# Patient Record
Sex: Female | Born: 1985 | Race: White | Hispanic: No | Marital: Married | Smoking: Former smoker
Health system: Southern US, Community
[De-identification: ages and names within clinical notes are randomized; demographics above are authoritative.]

## PROBLEM LIST (undated history)

## (undated) DIAGNOSIS — D649 Anemia, unspecified: Secondary | ICD-10-CM

## (undated) DIAGNOSIS — G4730 Sleep apnea, unspecified: Secondary | ICD-10-CM

## (undated) DIAGNOSIS — I10 Essential (primary) hypertension: Secondary | ICD-10-CM

## (undated) DIAGNOSIS — F32A Depression, unspecified: Secondary | ICD-10-CM

## (undated) DIAGNOSIS — C801 Malignant (primary) neoplasm, unspecified: Secondary | ICD-10-CM

## (undated) DIAGNOSIS — F329 Major depressive disorder, single episode, unspecified: Secondary | ICD-10-CM

## (undated) DIAGNOSIS — Z808 Family history of malignant neoplasm of other organs or systems: Secondary | ICD-10-CM

## (undated) DIAGNOSIS — Z800 Family history of malignant neoplasm of digestive organs: Secondary | ICD-10-CM

## (undated) HISTORY — DX: Family history of malignant neoplasm of other organs or systems: Z80.8

## (undated) HISTORY — DX: Family history of malignant neoplasm of digestive organs: Z80.0

---
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
## 2016-01-22 ENCOUNTER — Emergency Department
Admission: EM | Admit: 2016-01-22 | Discharge: 2016-01-22 | Disposition: A | Discharge status: Home / Self Care | Payer: 59 | Source: Home / Self Care | Attending: Family Medicine | Admitting: Family Medicine

## 2016-01-22 ENCOUNTER — Encounter: Payer: Self-pay | Admitting: Emergency Medicine

## 2016-01-22 DIAGNOSIS — G5603 Carpal tunnel syndrome, bilateral upper limbs: Secondary | ICD-10-CM

## 2016-01-22 DIAGNOSIS — M654 Radial styloid tenosynovitis [de Quervain]: Secondary | ICD-10-CM

## 2016-01-22 DIAGNOSIS — I10 Essential (primary) hypertension: Secondary | ICD-10-CM

## 2016-01-22 DIAGNOSIS — E6609 Other obesity due to excess calories: Secondary | ICD-10-CM

## 2016-01-22 DIAGNOSIS — E66811 Obesity, class 1: Secondary | ICD-10-CM

## 2016-01-22 HISTORY — DX: Depression, unspecified: F32.A

## 2016-01-22 HISTORY — DX: Major depressive disorder, single episode, unspecified: F32.9

## 2016-01-22 NOTE — ED Provider Notes (Signed)
R[facility] Her[facility]    CSN: [id number] Arrival date & time: 01/22/16  1747     History   Chief Complaint Chief Complaint  Patient presents with  . Wrist Pain    bilateral/chronic/typist  . Leg Swelling    bilateral/chronic  . Insomnia    chronic    HPI Rachel Herrera is a 30 y.o. female.   Patient presents at a late hour with multiple medical concerns.  She states that she realizes she will not be able to undergo comprehensive medical evaluation this evening, but wants to initiate the process.   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  She also notes gradually increasing weight gain and generalized swelling. She states that she has not visited a physician for about 10 years.  She has a past history of PCOS (she was unable to tolerate metformin).  She continues to smoke.   The history is provided by the patient.    Past Medical History:  Diagnosis Date  . Depression     There are no active problems to display for this patient.   History reviewed. No pertinent surgical history.  OB History    No data available       Home Medications    Prior to Admission medications   Medication Sig Start Date End Date Taking? Authorizing Provider  Multiple Vitamin (MULTIVITAMIN) capsule Take 1 capsule by mouth daily.   Yes Historical Provider, MD    Family History History reviewed. No pertinent family history.  Social History Social History  Substance Use Topics  . Smoking status: Current Every Day Smoker  . Smokeless tobacco: Never Used  . Alcohol use Yes     Allergies   Patient has no known allergies.   Review of Systems Review of Systems  Constitutional: Positive for activity change, fatigue and unexpected weight change. Negative for appetite change, chills, diaphoresis and fever.  HENT: Negative.    Eyes: Negative.   Respiratory: Negative for cough, chest tightness, shortness of breath and wheezing.   Cardiovascular: Positive for leg swelling. Negative for chest pain.  Gastrointestinal: Negative.   Genitourinary: Negative.   Musculoskeletal: Positive for arthralgias and joint swelling.  Skin: Negative.   Neurological: Positive for numbness.     Physical Exam Triage Vital Signs ED Triage Vitals  Enc Vitals Group     BP 01/22/16 1821 182/73     Pulse Rate 01/22/16 1821 98     Resp 01/22/16 1821 22     Temp 01/22/16 1821 98.3 F (36.8 C)     Temp Source 01/22/16 1821 Oral     SpO2 01/22/16 1821 94 %     Weight 01/22/16 1822 (!) 425 lb (192.8 kg)     Height 01/22/16 1822 5' 9.75" (1.772 m)     Head Circumference --      Peak Flow --      Pain Score 01/22/16 1824 2     Pain Loc --      Pain Edu? --      Excl. in [place]? --    No data found.   Updated Vital Signs BP 182/73 (BP Location: Left Wrist)   Pulse 98   Temp 98.3 F (36.8 C) (Oral)   Resp 22   Ht 5' 9.75" (1.772 m)   Wt (!) 425 lb (192.8 kg)   LMP  09/22/2015 (Approximate)   SpO2 94%   BMI 61.42 kg/m   Visual Acuity Right Eye Distance:   Left Eye Distance:   Bilateral Distance:    Right Eye Near:   Left Eye Near:    Bilateral Near:     Physical Exam Nursing notes and Vital Signs reviewed. Appearance:  Patient appears stated age, and in no acute distress.  Patient is obese (BMI 61.4) Eyes:  Pupils are equal, round, and reactive to light and accomodation.  Extraocular movement is intact.  Conjunctivae are not inflamed  Ears:  Normal externally Nose:  Normal turbinates.  No sinus tenderness.   Mouth:  Poor dentition  Pharynx:  Normal Neck:  Supple.  No adenopathy or thyromegaly. Lungs:  Clear to auscultation.  Breath sounds are equal.  Moving air well. Heart:  Regular rate and rhythm without murmurs, rubs, or gallops.  Abdomen:  Nontender without masses or hepatosplenomegaly.  Bowel sounds are  present.  No CVA or flank tenderness.  Extremities:  Pitting edema bilaterally; pedal pulses intact.  Bilateral wrists:  full range of motion.  Right wrist has distinct tenderness to palpation over thumb extensor tendons.  Phalen's test positive right wrist.  Distal neurovascular function is intact.  Skin:  No rash present.    UC Treatments / Results  Labs (all labs ordered are listed, but only abnormal results are displayed) Labs Reviewed - No data to display  EKG  EKG Interpretation None       Radiology No results found.  Procedures Procedures (including critical care time)  Medications Ordered in UC Medications - No data to display   Initial Impression / Assessment and Plan / UC Course  I have reviewed the triage vital signs and the nursing notes.  Pertinent labs & imaging results that were available during my care of the patient were reviewed by me and considered in my medical decision making (see chart for details).  Clinical Course   Suspect OSA Followup with PCP for workup and evaluation of multiple medical problems.     Final Clinical Impressions(s) / UC Diagnoses   Final diagnoses:  Essential hypertension  Class 1 obesity due to excess calories with serious comorbidity in adult, unspecified BMI  Bilateral carpal tunnel syndrome  De Quervain's tenosynovitis, right    New Prescriptions New Prescriptions   No medications on file     Kandra Nicolas, MD 02/04/16 1728

## 2016-01-22 NOTE — ED Triage Notes (Signed)
Patient has not seen MD for about 10 years; has concerns about known depression (no meds); bilateral wrist pain (typist); edema of legs (bilateral/chronic) and morbid obesity. No PCP, but wants to establish.

## 2016-02-02 ENCOUNTER — Encounter: Payer: Self-pay | Admitting: Physician Assistant

## 2016-02-02 ENCOUNTER — Ambulatory Visit (INDEPENDENT_AMBULATORY_CARE_PROVIDER_SITE_OTHER): Payer: 59 | Admitting: Physician Assistant

## 2016-02-02 VITALS — BP 179/83 | HR 99 | Ht 69.0 in | Wt >= 6400 oz

## 2016-02-02 DIAGNOSIS — Z0000 Encounter for general adult medical examination without abnormal findings: Secondary | ICD-10-CM

## 2016-02-02 DIAGNOSIS — E282 Polycystic ovarian syndrome: Secondary | ICD-10-CM

## 2016-02-02 DIAGNOSIS — I10 Essential (primary) hypertension: Secondary | ICD-10-CM

## 2016-02-02 DIAGNOSIS — M654 Radial styloid tenosynovitis [de Quervain]: Secondary | ICD-10-CM | POA: Insufficient documentation

## 2016-02-02 DIAGNOSIS — E66813 Obesity, class 3: Secondary | ICD-10-CM | POA: Insufficient documentation

## 2016-02-02 DIAGNOSIS — Z23 Encounter for immunization: Secondary | ICD-10-CM | POA: Diagnosis not present

## 2016-02-02 DIAGNOSIS — G4719 Other hypersomnia: Secondary | ICD-10-CM

## 2016-02-02 MED ORDER — METFORMIN HCL 500 MG PO TABS: ORAL_TABLET | ORAL | 3 refills | Status: DC

## 2016-02-02 MED ORDER — MELOXICAM 15 MG PO TABS: 15.0000 mg | ORAL_TABLET | Freq: Every day | ORAL | 0 refills | Status: DC

## 2016-02-02 MED ORDER — CHLORTHALIDONE 25 MG PO TABS: 12.5000 mg | ORAL_TABLET | Freq: Every day | ORAL | 0 refills | Status: DC

## 2016-02-02 NOTE — Progress Notes (Signed)
HPI:                                                                Rachel Herrera is a 30 y.o. female who presents to [facility] today to establish care and concerns about sleep apnea and hand/wrist pain  Hand/Wrist Pain: Bilateral wrist and hand pain, waxing and waning for months. Describes pain as sharp and stabbing. Pain is worse with typing and movement. Noticed that right hand was weaker since October. She has difficulty grasping a pen. 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 Endorses some paresthesias in her 4th and 5th digits. This is affecting her professionally and she is concerned she is going to lose her job. She has been taking Ibuprofen 400-600mg  with mild relief.  History of PCOS: has been on Metformin for a total of 6 months while in college, but discontinued due to diarrhea.  Obesity (BMI 64): 10 pound weight gain in the last 11 days. She is interested in weight loss medications. She states joint pain limits her ability to exercise.  Preventive Care:  Pap smear: due  TDAP: due  Health Maintenance Health Maintenance  Topic Date Due  . HIV Screening  12/16/2000  . TETANUS/TDAP  12/16/2004  . PAP SMEAR  12/17/2006  . INFLUENZA VACCINE  Completed    GYN/Sexual Health  Menstrual status: having periods  LMP: July 2017  Menses: irregular  Last pap smear: 10 years ago  History of abnormal pap smears: no   Past Medical History:  Diagnosis Date  . Depression    History reviewed. No pertinent surgical history. Social History  Substance Use Topics  . Smoking status: Current Every Day Smoker  . Smokeless tobacco: Never Used  . Alcohol use Yes   family history includes Heart attack in her maternal grandfather and maternal uncle; Hyperlipidemia in her maternal aunt.  Review of Systems  Constitutional: Negative for chills, fever, malaise/fatigue and weight loss.  HENT: Negative.   Eyes:  Positive for blurred vision (wears corrective lenses).  Respiratory: Negative for shortness of breath and wheezing.   Cardiovascular: Positive for leg swelling and PND. Negative for chest pain and palpitations.  Gastrointestinal: Negative for abdominal pain, constipation, diarrhea and heartburn.  Genitourinary: Negative.   Musculoskeletal: Positive for joint pain (b/l wrists).  Skin: Negative.   Neurological: Positive for tingling (4th and 5th digits) and focal weakness (grip strength). Negative for dizziness and headaches.  Endo/Heme/Allergies: Negative.   Psychiatric/Behavioral: Negative.  Negative for depression.    Medications: Current Outpatient Prescriptions  Medication Sig Dispense Refill  . IBUPROFEN PO Take by mouth.    . Multiple Vitamin (MULTIVITAMIN) capsule Take 1 capsule by mouth daily.     No current facility-administered medications for this visit.    No Known Allergies     Objective:  BP (!) 179/83   Pulse 99   Ht 5\' 9"  (1.753 m)   Wt (!) 435 lb (197.3 kg)   LMP 09/22/2015 (Approximate)   BMI 64.24 kg/m  Gen: well-groomed, cooperative, pale, no distress HEENT: Extraocular movements intact, normal conjunctiva, TM's clear, oropharynx clear, moist mucus membranes, poor dentition, no thyromegaly or tenderness Lungs: Normal work of breathing, clear to auscultation bilaterally Heart: Normal  rate, regular rhythm, s1 and s2 distinct, no murmurs, clicks or rubs appreciated on this exam, no carotid bruit Abd: Soft. Nondistended, Nontender Neuro: alert and oriented x 3, EOM's intact, PERRLA, sensation intact in bilaterally hands  MSK: grip strength 4/5 bilaterally, full active ROM of wrist flexion/extension, positive Finkelstein test, negative Tinel's sign, tenderness over the medial wrist and tendon sheath Extremities: distal pulses intact, 1+ pitting edema bilaterally Skin: pallor, warm and dry, no rashes on exposed skin, birth mark on left cheek and forehead Psych:  normal affect, pleasant mood, normal speech and thought content   No results found for this or any previous visit (from the past 72 hour(s)). No results found.    Assessment and Plan: 30 y.o. female with   1. Encounter for preventative adult health care examination - CBC - Comprehensive metabolic panel - Hemoglobin A1c - Lipid panel - VITAMIN D 25 Hydroxy (Vit-D Deficiency, Fractures) - TSH - instructed to schedule appt for Pap smear - Tdap given today  2. Excessive daytime sleepiness - positive STOP BANG screen (5/8) - Ambulatory referral to Sleep Studies  3. PCOS (polycystic ovarian syndrome) - metFORMIN (GLUCOPHAGE) 500 MG tablet; Take 1 tablet with breakfast for 1 week. Then increase to another tablet with dinner.    4. Essential hypertension - BP elevated in clinic today and patient reports elevated BP's in the past - chlorthalidone (HYGROTON) 25 MG tablet; Take 0.5 tablets (12.5 mg total) by mouth daily.  Dispense: 30 tablet; Refill: 0  5. Morbid obesity ([place]) - restarting Metformin - gave patient handouts for other weight loss medications to consider  6. De Quervain's tenosynovitis, bilateral - thumb spica splints - referral to Sports Medicine   Patient education and anticipatory guidance given Patient agrees with treatment plan Follow-up in 2 weeks or sooner as needed  Darlyne Russian PA-C

## 2016-02-02 NOTE — Patient Instructions (Signed)
I've ordered fasting labs (nothing to eat or drink after midnight or at least 8 hours before your blood draw). Lab is open 8-5 M-F, closed for lunch 12:30-1:30. You can have water and your medications Return in 2 weeks to see Dr. Dianah Field, Sports Medicine for your wrist pain. He will also check your blood pressure at that visit I have sent a prescription for blood pressure medicine (chlorthalidone) I have also sent a prescription for Metformin. For the first week, take 1 tab with breakfast. Then you can increase to twice a day. Always take this medicine with meals.  Chlorthalidone tablets What is this medicine? CHLORTHALIDONE (klor THAL i done) is a diuretic. It increases the amount of urine passed, which causes the body to lose salt and water. This medicine is used to treat high blood pressure and edema or water retention. This medicine may be used for other purposes; ask your health care provider or pharmacist if you have questions. COMMON BRAND NAME(S): Thalitone What should I tell my health care provider before I take this medicine? They need to know if you have any of these conditions: -asthma -diabetes -gout -kidney disease -liver disease -parathyroid disease -systemic lupus erythematosus (SLE) -taking cortisone, digoxin, lithium carbonate, or drugs for diabetes -an unusual or allergic reaction to chlorthalidone, sulfa drugs, other medicines, foods, dyes, or preservatives -pregnant or trying to get pregnant -breast-feeding How should I use this medicine? Take this medicine by mouth with a glass of water. Follow the directions on the prescription label. It is best to take your dose in the morning with food. Take your medicine at regular intervals. Do not take your medicine more often than directed. Do not stop taking except on your doctor's advice. Talk to your pediatrician regarding the use of this medicine in children. Special care may be needed. Overdosage: If you think you have  taken too much of this medicine contact a poison control center or emergency room at once. NOTE: This medicine is only for you. Do not share this medicine with others. What if I miss a dose? If you miss a dose, take it as soon as you can. If it is almost time for your next dose, take only that dose. Do not take double or extra doses. What may interact with this medicine? -barbiturate medicines for sleep or seizure control -digoxin -lithium -medicines for diabetes -norepinephrine -other medicines for high blood pressure -some pain medicines -steroid hormones like prednisone, cortisone, hydrocortisone, corticotropin -tubocurarine This list may not describe all possible interactions. Give your health care provider a list of all the medicines, herbs, non-prescription drugs, or dietary supplements you use. Also tell them if you smoke, drink alcohol, or use illegal drugs. Some items may interact with your medicine. What should I watch for while using this medicine? Visit your doctor or health care professional for regular check ups. Check your blood pressure as directed. Ask your doctor or health care professional what your blood pressure should be and when you should contact him or her. You may need to be on a special diet while taking this medicine. Ask your doctor. You may get drowsy or dizzy. Do not drive, use machinery, or do anything that needs mental alertness until you know how this medicine affects you. Do not stand or sit up quickly, especially if you are an older patient. This reduces the risk of dizzy or fainting spells. Alcohol may interfere with the effect of this medicine. Avoid alcoholic drinks. This medicine may affect your blood  sugar level. If you have diabetes, check with your doctor or health care professional before changing the dose of your diabetic medicine. This medicine can make you more sensitive to the sun. Keep out of the sun. If you cannot avoid being in the sun, wear  protective clothing and use sunscreen. Do not use sun lamps or tanning beds/booths. What side effects may I notice from receiving this medicine? Side effects that you should report to your doctor or health care professional as soon as possible: -allergic reactions like skin rash, itching or hives, swelling of the face, lips, or tongue -dark urine -dry mouth -excess thirst -fast, irregular heart rate -fever, chills -muscle pain, cramps, or spasm -nausea, vomiting -redness, blistering, peeling or loosening of the skin, including inside the mouth -tingling, pain or numbness in the hands or feet -unusually weak or tired -yellowing of the eyes or skin Side effects that usually do not require medical attention (report to your doctor or health care professional if they continue or are bothersome): -diarrhea or constipation -headache -impotence -loss of appetite -stomach upset This list may not describe all possible side effects. Call your doctor for medical advice about side effects. You may report side effects to FDA at 1-800-FDA-1088. Where should I keep my medicine? Keep out of the reach of children. Store at room temperature between 15 and 30 degrees C (59 and 86 degrees F). Keep container tightly closed. Throw away any unused medicine after the expiration date. NOTE: This sheet is a summary. It may not cover all possible information. If you have questions about this medicine, talk to your doctor, pharmacist, or health care provider.  2017 Elsevier/Gold Standard (2007-04-29 15:28:48)  Metformin tablets What is this medicine? METFORMIN (met FOR min) is used to treat type 2 diabetes. It helps to control blood sugar. Treatment is combined with diet and exercise. This medicine can be used alone or with other medicines for diabetes. This medicine may be used for other purposes; ask your health care provider or pharmacist if you have questions. COMMON BRAND NAME(S): Glucophage What should I  tell my health care provider before I take this medicine? They need to know if you have any of these conditions: -anemia -frequently drink alcohol-containing beverages -become easily dehydrated -heart attack -heart failure that is treated with medications -kidney disease -liver disease -polycystic ovary syndrome -serious infection or injury -vomiting -an unusual or allergic reaction to metformin, other medicines, foods, dyes, or preservatives -pregnant or trying to get pregnant -breast-feeding How should I use this medicine? Take this medicine by mouth. Take it with meals. Swallow the tablets with a drink of water. Follow the directions on the prescription label. Take your medicine at regular intervals. Do not take your medicine more often than directed. Talk to your pediatrician regarding the use of this medicine in children. While this drug may be prescribed for children as young as 47 years of age for selected conditions, precautions do apply. Overdosage: If you think you have taken too much of this medicine contact a poison control center or emergency room at once. NOTE: This medicine is only for you. Do not share this medicine with others. What if I miss a dose? If you miss a dose, take it as soon as you can. If it is almost time for your next dose, take only that dose. Do not take double or extra doses. What may interact with this medicine? Do not take this medicine with any of the following medications: -dofetilide -gatifloxacin -certain  contrast medicines given before X-rays, CT scans, MRI, or other procedures This medicine may also interact with the following medications: -acetazolamide -certain medicines for HIV infection or hepatitis, like adefovir, emtricitabine, entecavir, lamivudine, or tenofovir -cimetidine -crizotinib -digoxin -diuretics -female hormones, like estrogens or progestins and birth control pills -glycopyrrolate -isoniazid -lamotrigine -medicines for  blood pressure, heart disease, irregular heart beat -memantine -midodrine -methazolamide -morphine -nicotinic acid -phenothiazines like chlorpromazine, mesoridazine, prochlorperazine, thioridazine -phenytoin -procainamide -propantheline -quinidine -quinine -ranitidine -ranolazine -steroid medicines like prednisone or cortisone -stimulant medicines for attention disorders, weight loss, or to stay awake -thyroid medicines -topiramate -trimethoprim -trospium -vancomycin -vandetanib -zonisamide This list may not describe all possible interactions. Give your health care provider a list of all the medicines, herbs, non-prescription drugs, or dietary supplements you use. Also tell them if you smoke, drink alcohol, or use illegal drugs. Some items may interact with your medicine. What should I watch for while using this medicine? Visit your doctor or health care professional for regular checks on your progress. A test called the HbA1C (A1C) will be monitored. This is a simple blood test. It measures your blood sugar control over the last 2 to 3 months. You will receive this test every 3 to 6 months. Learn how to check your blood sugar. Learn the symptoms of low and high blood sugar and how to manage them. Always carry a quick-source of sugar with you in case you have symptoms of low blood sugar. Examples include hard sugar candy or glucose tablets. Make sure others know that you can choke if you eat or drink when you develop serious symptoms of low blood sugar, such as seizures or unconsciousness. They must get medical help at once. Tell your doctor or health care professional if you have high blood sugar. You might need to change the dose of your medicine. If you are sick or exercising more than usual, you might need to change the dose of your medicine. Do not skip meals. Ask your doctor or health care professional if you should avoid alcohol. Many nonprescription cough and cold products  contain sugar or alcohol. These can affect blood sugar. This medicine may cause ovulation in premenopausal women who do not have regular monthly periods. This may increase your chances of becoming pregnant. You should not take this medicine if you become pregnant or think you may be pregnant. Talk with your doctor or health care professional about your birth control options while taking this medicine. Contact your doctor or health care professional right away if think you are pregnant. If you are going to need surgery, a MRI, CT scan, or other procedure, tell your doctor that you are taking this medicine. You may need to stop taking this medicine before the procedure. Wear a medical ID bracelet or chain, and carry a card that describes your disease and details of your medicine and dosage times. What side effects may I notice from receiving this medicine? Side effects that you should report to your doctor or health care professional as soon as possible: -allergic reactions like skin rash, itching or hives, swelling of the face, lips, or tongue -breathing problems -feeling faint or lightheaded, falls -muscle aches or pains -signs and symptoms of low blood sugar such as feeling anxious, confusion, dizziness, increased hunger, unusually weak or tired, sweating, shakiness, cold, irritable, headache, blurred vision, fast heartbeat, loss of consciousness -slow or irregular heartbeat -unusual stomach pain or discomfort -unusually tired or weak Side effects that usually do not require medical attention (report  to your doctor or health care professional if they continue or are bothersome): -diarrhea -headache -heartburn -metallic taste in mouth -nausea -stomach gas, upset This list may not describe all possible side effects. Call your doctor for medical advice about side effects. You may report side effects to FDA at 1-800-FDA-1088. Where should I keep my medicine? Keep out of the reach of  children. Store at room temperature between 15 and 30 degrees C (59 and 86 degrees F). Protect from moisture and light. Throw away any unused medicine after the expiration date. NOTE: This sheet is a summary. It may not cover all possible information. If you have questions about this medicine, talk to your doctor, pharmacist, or health care provider.  2017 Elsevier/Gold Standard (2013-07-07 22:14:40)

## 2016-02-15 ENCOUNTER — Ambulatory Visit (INDEPENDENT_AMBULATORY_CARE_PROVIDER_SITE_OTHER): Payer: 59 | Admitting: Sports Medicine

## 2016-02-15 ENCOUNTER — Encounter: Payer: Self-pay | Admitting: Sports Medicine

## 2016-02-15 ENCOUNTER — Other Ambulatory Visit: Payer: Self-pay | Admitting: Physician Assistant

## 2016-02-15 DIAGNOSIS — M654 Radial styloid tenosynovitis [de Quervain]: Secondary | ICD-10-CM

## 2016-02-15 DIAGNOSIS — G4719 Other hypersomnia: Secondary | ICD-10-CM

## 2016-02-15 NOTE — Progress Notes (Signed)
Subjective:    I'm seeing this patient as a consultation for:  Nelson Chimes, PA-C  CC: Bilateral wrist pain  HPI: This is a pleasant 31 year old female, she works with data entry. For the past several months she's had pain that she localizes on the radial aspect of both wrists, with radiation into the thumb and into the dorsal forearm. She was appropriately treated with immobilization in a thumb spica brace, but unfortunately has only improved on the right side to some degree. Left side still hurts significantly. Severe, persistent, agreeable to proceed with interventional treatment today.  Past medical history:  Negative.  See flowsheet/record as well for more information.  Surgical history: Negative.  See flowsheet/record as well for more information.  Family history: Negative.  See flowsheet/record as well for more information.  Social history: Negative.  See flowsheet/record as well for more information.  Allergies, and medications have been entered into the medical record, reviewed, and no changes needed.   Review of Systems: No headache, visual changes, nausea, vomiting, diarrhea, constipation, dizziness, abdominal pain, skin rash, fevers, chills, night sweats, weight loss, swollen lymph nodes, body aches, joint swelling, muscle aches, chest pain, shortness of breath, mood changes, visual or auditory hallucinations.   Objective:   General: Well Developed, well nourished, and in no acute distress.  Neuro/Psych: Alert and oriented x3, extra-ocular muscles intact, able to move all 4 extremities, sensation grossly intact. Skin: Warm and dry, no rashes noted.  Respiratory: Not using accessory muscles, speaking in full sentences, trachea midline.  Cardiovascular: Pulses palpable, no extremity edema. Abdomen: Does not appear distended. Bilateral wrists: Inspection normal with no visible erythema or swelling. ROM smooth and normal with good flexion and extension and ulnar/radial  deviation that is symmetrical with opposite wrist. Palpation is normal over metacarpals, navicular, lunate, and TFCC; tendons without tenderness/ swelling No snuffbox tenderness. No tenderness over Canal of Guyon. Strength 5/5 in all directions without pain. Negative Tinel and Phalen signs, positive Finkelstein sign bilaterally Negative Watson's test.  Procedure: Real-time Ultrasound Guided Injection of left first extensor compartment Device: GE Logiq E  Verbal informed consent obtained.  Time-out conducted.  Noted no overlying erythema, induration, or other signs of local infection.  Skin prepped in a sterile fashion.  Local anesthesia: Topical Ethyl chloride.  With sterile technique and under real time ultrasound guidance:  I advanced a 25-gauge needle under real-time guidance between the abductor pollicis longus and the extensor pollicis brevis tendons and injected a total of 1 mL kenalog 40, 1 mL lidocaine. Completed without difficulty  Pain immediately resolved suggesting accurate placement of the medication.  Advised to call if fevers/chills, erythema, induration, drainage, or persistent bleeding.  Images permanently stored and available for review in the ultrasound unit.  Impression: Technically successful ultrasound guided injection.  Procedure: Real-time Ultrasound Guided Injection of right first extensor compartment Device: GE Logiq E  Verbal informed consent obtained.  Time-out conducted.  Noted no overlying erythema, induration, or other signs of local infection.  Skin prepped in a sterile fashion.  Local anesthesia: Topical Ethyl chloride.  With sterile technique and under real time ultrasound guidance:  I advanced a 25-gauge needle under real-time guidance between the abductor pollicis longus and the extensor pollicis brevis tendons and injected a total of 1 mL kenalog 40, 1 mL lidocaine. Completed without difficulty  Pain immediately resolved suggesting accurate  placement of the medication.  Advised to call if fevers/chills, erythema, induration, drainage, or persistent bleeding.  Images permanently stored and available  for review in the ultrasound unit.  Impression: Technically successful ultrasound guided injection.  Impression and Recommendations:   This case required medical decision making of moderate complexity.  De Quervain's tenosynovitis, bilateral Improvement on the right to some degree but not the left with thumb spica brace in. We are going to proceed with bilateral first extensor compartment injections. Return to see me in one month ago over improvement. FMLA paperwork filled out today.

## 2016-02-15 NOTE — Assessment & Plan Note (Signed)
Improvement on the right to some degree but not the left with thumb spica brace in. We are going to proceed with bilateral first extensor compartment injections. Return to see me in one month ago over improvement. FMLA paperwork filled out today.

## 2016-02-17 ENCOUNTER — Telehealth: Payer: Self-pay

## 2016-02-17 NOTE — Telephone Encounter (Signed)
Pt left VM stating that her job requires a note to return back to work. Please assist.

## 2016-02-17 NOTE — Telephone Encounter (Signed)
Letter in box. 

## 2016-02-29 ENCOUNTER — Other Ambulatory Visit: Payer: Self-pay | Admitting: Physician Assistant

## 2016-02-29 DIAGNOSIS — M654 Radial styloid tenosynovitis [de Quervain]: Secondary | ICD-10-CM

## 2016-03-19 ENCOUNTER — Ambulatory Visit (INDEPENDENT_AMBULATORY_CARE_PROVIDER_SITE_OTHER): Payer: 59 | Admitting: Sports Medicine

## 2016-03-19 ENCOUNTER — Encounter: Payer: Self-pay | Admitting: Sports Medicine

## 2016-03-19 DIAGNOSIS — M654 Radial styloid tenosynovitis [de Quervain]: Secondary | ICD-10-CM | POA: Diagnosis not present

## 2016-03-19 NOTE — Progress Notes (Signed)
  Subjective:    CC: Follow-up  HPI: Bilateral wrist pain: We did bilateral first extensor compartment injections at the last visit and she returns today pain-free most days.  Past medical history:  Negative.  See flowsheet/record as well for more information.  Surgical history: Negative.  See flowsheet/record as well for more information.  Family history: Negative.  See flowsheet/record as well for more information.  Social history: Negative.  See flowsheet/record as well for more information.  Allergies, and medications have been entered into the medical record, reviewed, and no changes needed.   Review of Systems: No fevers, chills, night sweats, weight loss, chest pain, or shortness of breath.   Objective:    General: Well Developed, well nourished, and in no acute distress.  Neuro: Alert and oriented x3, extra-ocular muscles intact, sensation grossly intact.  HEENT: Normocephalic, atraumatic, pupils equal round reactive to light, neck supple, no masses, no lymphadenopathy, thyroid nonpalpable.  Skin: Warm and dry, no rashes. Cardiac: Regular rate and rhythm, no murmurs rubs or gallops, no lower extremity edema.  Respiratory: Clear to auscultation bilaterally. Not using accessory muscles, speaking in full sentences. Bilateral Wrists: Inspection normal with no visible erythema or swelling. ROM smooth and normal with good flexion and extension and ulnar/radial deviation that is symmetrical with opposite wrist. Palpation is normal over metacarpals, navicular, lunate, and TFCC; tendons without tenderness/ swelling No snuffbox tenderness. No tenderness over Canal of Guyon. Strength 5/5 in all directions without pain. Negative Finkelstein, tinel's and phalens. Negative Watson's test.  Impression and Recommendations:    De Quervain's tenosynovitis, bilateral Essentially resolved after bilateral trochanteric bursa injections, continue rehabilitation exercises, return as  needed.

## 2016-03-19 NOTE — Assessment & Plan Note (Signed)
Essentially resolved after bilateral trochanteric bursa injections, continue rehabilitation exercises, return as needed.

## 2016-03-20 ENCOUNTER — Ambulatory Visit (HOSPITAL_BASED_OUTPATIENT_CLINIC_OR_DEPARTMENT_OTHER): Payer: 59 | Attending: Physician Assistant | Admitting: Internal Medicine

## 2016-03-20 VITALS — Ht 69.0 in | Wt 378.0 lb

## 2016-03-20 DIAGNOSIS — G4719 Other hypersomnia: Secondary | ICD-10-CM

## 2016-03-20 DIAGNOSIS — G4733 Obstructive sleep apnea (adult) (pediatric): Secondary | ICD-10-CM | POA: Insufficient documentation

## 2016-03-20 DIAGNOSIS — G4761 Periodic limb movement disorder: Secondary | ICD-10-CM | POA: Insufficient documentation

## 2016-03-20 DIAGNOSIS — G4710 Hypersomnia, unspecified: Secondary | ICD-10-CM | POA: Diagnosis present

## 2016-03-30 ENCOUNTER — Encounter: Payer: Self-pay | Admitting: Physician Assistant

## 2016-03-30 ENCOUNTER — Other Ambulatory Visit: Payer: Self-pay | Admitting: Physician Assistant

## 2016-03-30 DIAGNOSIS — I10 Essential (primary) hypertension: Secondary | ICD-10-CM

## 2016-03-31 DIAGNOSIS — G4719 Other hypersomnia: Secondary | ICD-10-CM

## 2016-03-31 NOTE — Procedures (Signed)
Patient Name: Rachel Herrera, Rachel Herrera Date: 03/20/2016 Gender: Female D.O.B: 29-Aug-1985 Age (years): 51 Referring Provider: Trixie Dredge PA-C Height (inches): 69 Interpreting Physician: Baird Lyons MD, ABSM Weight (lbs): 378 RPSGT: Jacolyn Reedy BMI: 56 MRN: [id number] Neck Size: 19.50 CLINICAL INFORMATION Sleep Study Type: Split protocol polysomnogram with CPAP titration     Indication for sleep study: Excessive Daytime Sleepiness     Epworth Sleepiness Score: 10  SLEEP STUDY TECHNIQUE As per the AASM Manual for the Scoring of Sleep and Associated Events v2.3 (April 2016) with a hypopnea requiring 4% desaturations.  The channels recorded and monitored were frontal, central and occipital EEG, electrooculogram (EOG), submentalis EMG (chin), nasal and oral airflow, thoracic and abdominal wall motion, anterior tibialis EMG, snore microphone, electrocardiogram, and pulse oximetry. Continuous positive airway pressure (CPAP) was initiated when the patient met split night criteria and was titrated according to treat sleep-disordered breathing.  MEDICATIONS Medications self-administered by patient taken the night of the study : none reported  RESPIRATORY PARAMETERS Diagnostic  Total AHI (/hr): 63.6 RDI (/hr): 64.2 OA Index (/hr): - CA Index (/hr): 0.0 REM AHI (/hr): 52.5 NREM AHI (/hr): 64.6 Supine AHI (/hr): N/A Non-supine AHI (/hr): 63.57 Min O2 Sat (%): 59.00 Mean O2 (%): 84.32 Time below 88% (min): 65.0   Titration  Optimal Pressure (cm): 18 AHI at Optimal Pressure (/hr): 0.0 Min O2 at Optimal Pressure (%): 86.0 Supine % at Optimal (%): 100 Sleep % at Optimal (%): 99    SLEEP ARCHITECTURE The recording time for the entire night was 428.1 minutes.  During a baseline period of 204.2 minutes, the patient slept for 92.5 minutes in REM and nonREM, yielding a sleep efficiency of 45.3%. Sleep onset after lights out was 53.6 minutes with a REM latency of  127.0 minutes. The patient spent 20.00% of the night in stage N1 sleep, 62.16% in stage N2 sleep, 9.19% in stage N3 and 8.65% in REM.  During the titration period of 220.9 minutes, the patient slept for 186.0 minutes in REM and nonREM, yielding a sleep efficiency of 84.2%. Sleep onset after CPAP initiation was 13.8 minutes with a REM latency of 81.5 minutes. The patient spent 9.68% of the night in stage N1 sleep, 32.53% in stage N2 sleep, 28.76% in stage N3 and 29.03% in REM.  CARDIAC DATA The 2 lead EKG demonstrated sinus rhythm. The mean heart rate was 79.58 beats per minute. Other EKG findings include: None.  LEG MOVEMENT DATA The total Periodic Limb Movements of Sleep (PLMS) were 403. The PLMS index was 86.82 .  IMPRESSIONS - Severe obstructive sleep apnea occurred during the diagnostic portion of the study (AHI = 63.6/hour). An optimal PAP pressure was selected for this patient ( 18 cm of water) - No significant central sleep apnea occurred during the diagnostic portion of the study (CAI = 0.0/hour). - Severe oxygen desaturation was noted during the diagnostic portion of the study (Min O2 = 59.00%). - The patient snored with Loud snoring volume during the diagnostic portion of the study. - No cardiac abnormalities were noted during this study. - Severe periodic limb movements of sleep occurred during the study. - This was done as a daytime study to accommodate the patient's usual sleep schedule.  DIAGNOSIS - Obstructive Sleep Apnea (327.23 [G47.33 ICD-10]) - Periodic Limb Movement Syndrome (327.51 [G47.61 ICD-10])  RECOMMENDATIONS - Trial of CPAP therapy on 18 cm H2O with a Medium size Fisher&Paykel Full Face Mask Simplus mask and heated humidification. - Avoid alcohol, sedatives  and other CNS depressants that may worsen sleep apnea and disrupt normal sleep architecture. - Sleep hygiene should be reviewed to assess factors that may improve sleep quality. - Weight management and  regular exercise should be initiated or continued. - If limb movement sleep disturbance persists after control of sleep apnea, then a trial of specific therapy might be considered.  [Electronically signed] 03/31/2016 03:29 PM  Baird Lyons MD, [place], American Board of Sleep Medicine   NPI: [id number]  [place], American Board of Sleep Medicine  ELECTRONICALLY SIGNED ON:  03/31/2016, 3:21 PM [place] PH: [phone]   FX: [phone] [place]

## 2016-04-02 ENCOUNTER — Encounter: Payer: Self-pay | Admitting: Physician Assistant

## 2016-04-02 DIAGNOSIS — G4733 Obstructive sleep apnea (adult) (pediatric): Secondary | ICD-10-CM | POA: Insufficient documentation

## 2016-06-26 ENCOUNTER — Other Ambulatory Visit: Payer: Self-pay | Admitting: Physician Assistant

## 2016-06-26 DIAGNOSIS — I10 Essential (primary) hypertension: Secondary | ICD-10-CM

## 2018-11-10 ENCOUNTER — Ambulatory Visit (INDEPENDENT_AMBULATORY_CARE_PROVIDER_SITE_OTHER): Payer: 59 | Admitting: Osteopathic Medicine

## 2018-11-10 ENCOUNTER — Other Ambulatory Visit: Payer: Self-pay

## 2018-11-10 ENCOUNTER — Encounter: Payer: Self-pay | Admitting: Osteopathic Medicine

## 2018-11-10 VITALS — BP 157/65 | HR 83 | Temp 98.4°F | Wt 392.1 lb

## 2018-11-10 DIAGNOSIS — Z23 Encounter for immunization: Secondary | ICD-10-CM | POA: Diagnosis not present

## 2018-11-10 DIAGNOSIS — G4733 Obstructive sleep apnea (adult) (pediatric): Secondary | ICD-10-CM | POA: Diagnosis not present

## 2018-11-10 DIAGNOSIS — D500 Iron deficiency anemia secondary to blood loss (chronic): Secondary | ICD-10-CM

## 2018-11-10 NOTE — Patient Instructions (Addendum)
Will get CPAP Will see what OBGYN says! Will repeat labs about 6 weeks

## 2018-11-10 NOTE — Progress Notes (Signed)
HPI: Rachel Herrera is a 33 y.o. female who  has a past medical history of Depression.  she presents to [facility] today, 11/10/18,  for chief complaint of: Hospital follow-up - anemia   Hasn't seen Charley since 03/2016. Lost insurance for a bit.   Recent hospitalization: Admitted 11/02/2018, discharged 11/04/2018.    Presented to ER initially with shortness of breath.  Hemoglobin in ER was 2.6, iron 15, TIBC 446.   History of menorrhagia, probable PCOS.   Diagnosed with acute blood loss anemia suspect superimposed on chronic blood loss secondary to menorrhagia but no prior lab values were available for comparison.    Transfused total of 7 units packed red blood cells hemoglobin improved to 8.  Elevated BNP and CT showed pulmonary edema, TTE showed normal systolic and diastolic function.  Patient was referred to gynecology  Reports significant fatigue issues, longstanding but better since blood transfusion.  Patient would like to revisit sleep apnea issue, she was unable to get CPAP supplies after diagnosis from sleep study a couple of years ago, due to lack of insurance.  Reports significant daytime somnolence, fatigue.  Complicating factors include high blood pressure.    Past medical, surgical, social and family history reviewed:  Patient Active Problem List   Diagnosis Date Noted  . Obstructive sleep apnea 04/02/2016  . Excessive daytime sleepiness 02/02/2016  . PCOS (polycystic ovarian syndrome) 02/02/2016  . Essential hypertension 02/02/2016  . Morbid obesity ([place]) 02/02/2016  . De Quervain's tenosynovitis, bilateral 02/02/2016    No past surgical history on file.  Social History   Tobacco Use  . Smoking status: Current Every Day Smoker  . Smokeless tobacco: Never Used  Substance Use Topics  . Alcohol use: Yes    Family History  Problem Relation Age of Onset  . Hyperlipidemia Maternal Aunt   . Heart attack Maternal  Uncle   . Heart attack Maternal Grandfather      Current medication list and allergy/intolerance information reviewed:    Current Outpatient Medications  Medication Sig Dispense Refill  . Multiple Vitamin (MULTIVITAMIN) capsule Take 1 capsule by mouth daily.    . chlorthalidone (HYGROTON) 25 MG tablet Take 1 tablet (25 mg total) by mouth daily. (Patient not taking: Reported on 11/10/2018) 90 tablet 0  . IBUPROFEN PO Take by mouth.    . meloxicam (MOBIC) 15 MG tablet TAKE 1 TABLET (15 MG TOTAL) BY MOUTH DAILY. (Patient not taking: Reported on 11/10/2018) 30 tablet 0  . metFORMIN (GLUCOPHAGE) 500 MG tablet Take 1 tablet with breakfast for 1 week. Then increase to another tablet with dinner. (Patient not taking: Reported on 11/10/2018) 180 tablet 3   No current facility-administered medications for this visit.     No Known Allergies    Review of Systems:  Constitutional:  No  fever, no chills, No recent illness except as per HPI, No unintentional weight changes. +significant fatigue.   HEENT: No  headache, no vision change  Cardiac: No  chest pain, No  pressure, No palpitations, +Orthopnea  Respiratory:  No  shortness of breath. No  Cough  Gastrointestinal: No  abdominal pain, No  nausea  Musculoskeletal: No new myalgia/arthralgia  Skin: No  Rash, No other wounds/concerning lesions  Genitourinary: No  incontinence, No  abnormal genital bleeding, No abnormal genital discharge, +irregular and heavy periods longstanding problem   Endocrine: No cold intolerance,  No heat intolerance. No polyuria/polydipsia/polyphagia    Neurologic: No  Focal weakness, No  dizziness  Psychiatric: No  concerns with depression, +concerns with anxiety, No sleep problems, No mood problems  Exam:  BP (!) 157/65 (BP Location: Left Arm, Patient Position: Sitting, Cuff Size: Large)   Pulse 83   Temp 98.4 F (36.9 C) (Oral)   Wt (!) 392 lb 1.3 oz (177.8 kg)   BMI 57.90 kg/m   Constitutional: VS see  above. General Appearance: alert, well-developed, well-nourished, NAD  Eyes: Normal lids and conjunctive, non-icteric sclera  Ears, Nose, Mouth, Throat:TM normal bilaterally.  Neck: No masses, trachea midline. No thyroid enlargement. No tenderness/mass appreciated. No lymphadenopathy  Respiratory: Normal respiratory effort. no wheeze, no rhonchi, no rales  Cardiovascular: S1/S2 normal, no murmur, no rub/gallop auscultated. RRR. Trace lower extremity edema.   Gastrointestinal: Nontender, no masses. No hepatomegaly, no splenomegaly. No hernia appreciated. Bowel sounds normal. Rectal exam deferred.   Musculoskeletal: Gait normal. No clubbing/cyanosis of digits.   Neurological: Normal balance/coordination. No tremor. No cranial nerve deficit on limited exam. Motor and sensation intact and symmetric. Cerebellar reflexes intact.   Skin: warm, dry, intact. No rash/ulcer. No concerning nevi or subq nodules on limited exam.    Psychiatric: Normal judgment/insight. Normal mood and affect. Oriented x3.    No results found for this or any previous visit (from the past 72 hour(s)).  No results found.   ASSESSMENT/PLAN: The primary encounter diagnosis was Iron deficiency anemia due to chronic blood loss. Diagnoses of Need for influenza vaccination and OSA (obstructive sleep apnea) were also pertinent to this visit.  Doing well post-hospital discharge. Will try to get CPAP & supplies, if need to repeat sleep study pre insurance will order this.   Orders Placed This Encounter  Procedures  . Flu Vaccine QUAD 6+ mos PF IM (Fluarix Quad PF)  . CBC  . Fe+TIBC+Fer    Meds ordered this encounter  Medications  . AMBULATORY NON FORMULARY MEDICATION    Sig: Supply ordered: CPAP and other supplies needed (headgear, cushions, filters, heated tuubing and water chamber) Dx: obstructive sleep apnea Settings: auto-titration 5-20 cmH2O    Dispense:  1 Units    Refill:  prn    Patient Instructions   Will get CPAP Will see what OBGYN says! Will repeat labs about 6 weeks         Visit summary with medication list and pertinent instructions was printed for patient to review. All questions at time of visit were answered - patient instructed to contact office with any additional concerns or updates. ER/RTC precautions were reviewed with the patient.   Note: Total time spent 25 minutes, greater than 50% of the visit was spent face-to-face counseling and coordinating care for the above diagnoses listed in assessment/plan.   Please note: voice recognition software was used to produce this document, and typos may escape review. Please contact Dr. Sheppard Coil for any needed clarifications.     Follow-up plan: Return in about 6 weeks (around 12/22/2018) for LAB VISIT ONLY. ANNUAL CHECK UP W/ DR A IN 6 MOS, SOONER IF NEEDED!  Marland Kitchen

## 2018-11-11 MED ORDER — AMBULATORY NON FORMULARY MEDICATION: 99 refills | Status: AC

## 2018-11-12 ENCOUNTER — Telehealth: Payer: Self-pay | Admitting: Osteopathic Medicine

## 2018-11-12 NOTE — Telephone Encounter (Signed)
PT requested a back to work note. Relating to appointment on 11/10/18.

## 2018-11-12 NOTE — Telephone Encounter (Signed)
Return to work letter completed and sent to [employer]. Copy of the letter is also available for p/u at front desk. Pls contact and update patient. Thanks.

## 2018-11-20 ENCOUNTER — Telehealth: Payer: Self-pay | Admitting: Neurology

## 2018-11-20 NOTE — Telephone Encounter (Signed)
Patient left vm asking if she should have been put on HTN medication or if the plan was to wait?  She also wanted to let us know lower extremity swelling has gone down some, but skin is red. Please advise.

## 2018-11-21 MED ORDER — HYDROCHLOROTHIAZIDE 25 MG PO TABS: 25.0000 mg | ORAL_TABLET | Freq: Every day | ORAL | 0 refills | Status: DC

## 2018-11-21 NOTE — Telephone Encounter (Signed)
Bp Rx sent to pharmacy, or patient has option to see if BP improves w/ CPAP treatment. I think ok to go ahead and start medication

## 2018-11-21 NOTE — Telephone Encounter (Signed)
Pt advised.

## 2018-12-02 ENCOUNTER — Telehealth: Payer: Self-pay

## 2018-12-02 NOTE — Telephone Encounter (Signed)
Pt was informed that FMLA form was completed and faxed to [phone]. Confirmation rec'd. Pt aware original form is ready to be picked up at front desk. Copy of FMLA form placed in scan folder.

## 2018-12-08 ENCOUNTER — Other Ambulatory Visit: Payer: Self-pay | Admitting: Obstetrics & Gynecology

## 2018-12-11 NOTE — Progress Notes (Signed)
[id number] - [place], [place] [address] Phone: [phone] Fax: [phone]    Your procedure is scheduled on Tuesday, November 10th.  Report to [facility] Main Entrance "A" at 5:30 A.M., and check in at the Admitting office.  Call this number if you have problems the morning of surgery:  [phone]  Call [phone] if you have any questions prior to your surgery date Monday-Friday 8am-4pm   Remember:  Do not eat after midnight the night before your surgery  You may drink clear liquids until 4:30 A.M. the morning of your surgery.   Clear liquids allowed are: Water, Non-Citrus Juices (without pulp), Carbonated Beverages, Clear Tea, Black Coffee Only, and Gatorade    Take these medicines the morning of surgery with A SIP OF WATER  norethindrone (AYGESTIN)   As of today, STOP taking any Aspirin (unless otherwise instructed by your surgeon), meloxicam (MOBIC), Aleve, Naproxen, Ibuprofen, Motrin, Advil, Goody's, BC's, all herbal medications, fish oil, and all vitamins.  How to Manage Your Diabetes Before and After Surgery  WHAT DO I DO ABOUT MY DIABETES MEDICATION?  Marland Kitchen Do not take metFORMIN (GLUCOPHAGE)/oral diabetes medicines (pills) the morning of surgery.  Why is it important to control my blood sugar before and after surgery? . Improving blood sugar levels before and after surgery helps healing and can limit problems. . A way of improving blood sugar control is eating a healthy diet by: o  Eating less sugar and carbohydrates o  Increasing activity/exercise o  Talking with your doctor about reaching your blood sugar goals . High blood sugars (greater than 180 mg/dL) can raise your risk of infections and slow your recovery, so you will need to focus on controlling your diabetes during the weeks before surgery. . Make sure that the doctor who takes care of your diabetes knows about your planned surgery  including the date and location.  How do I manage my blood sugar before surgery? . Check your blood sugar at least 4 times a day, starting 2 days before surgery, to make sure that the level is not too high or low. o Check your blood sugar the morning of your surgery when you wake up and every 2 hours until you get to the Short Stay unit. . If your blood sugar is less than 70 mg/dL, you will need to treat for low blood sugar: o Do not take insulin. o Treat a low blood sugar (less than 70 mg/dL) with  cup of clear juice (cranberry or apple), 4 glucose tablets, OR glucose gel. Recheck blood sugar in 15 minutes after treatment (to make sure it is greater than 70 mg/dL). If your blood sugar is not greater than 70 mg/dL on recheck, call [phone] o  for further instructions. . Report your blood sugar to the short stay nurse when you get to Short Stay.  . If you are admitted to the hospital after surgery: o Your blood sugar will be checked by the staff and you will probably be given insulin after surgery (instead of oral diabetes medicines) to make sure you have good blood sugar levels. o The goal for blood sugar control after surgery is 80-180 mg/dL.  Reviewed and Endorsed by [facility] Patient Education Committee, August 2015  The Morning of Surgery  Do not wear jewelry, make-up or nail polish.  Do not wear lotions, powders, perfumes or deodorant  Do not shave 48 hours prior to surgery.   Do  not bring valuables to the hospital.  [facility] is not responsible for any belongings or valuables.  If you are a smoker, DO NOT Smoke 24 hours prior to surgery IF you wear a CPAP at night please bring your mask, tubing, and machine the morning of surgery   Remember that you must have someone to transport you home after your surgery, and remain with you for 24 hours if you are discharged the same day.  Contacts, glasses, hearing aids, dentures or bridgework may not be worn into surgery.   Leave  your suitcase in the car.  After surgery it may be brought to your room.  For patients admitted to the hospital, discharge time will be determined by your treatment team.  Patients discharged the day of surgery will not be allowed to drive home.   Special instructions:   [place]- Preparing For Surgery  Before surgery, you can play an important role. Because skin is not sterile, your skin needs to be as free of germs as possible. You can reduce the number of germs on your skin by washing with CHG (chlorahexidine gluconate) Soap before surgery.  CHG is an antiseptic cleaner which kills germs and bonds with the skin to continue killing germs even after washing.    Oral Hygiene is also important to reduce your risk of infection.  Remember - BRUSH YOUR TEETH THE MORNING OF SURGERY WITH YOUR REGULAR TOOTHPASTE  Please do not use if you have an allergy to CHG or antibacterial soaps. If your skin becomes reddened/irritated stop using the CHG.  Do not shave (including legs and underarms) for at least 48 hours prior to first CHG shower. It is OK to shave your face.  Please follow these instructions carefully.   1. Shower the NIGHT BEFORE SURGERY and the MORNING OF SURGERY with CHG Soap.   2. If you chose to wash your hair, wash your hair first as usual with your normal shampoo.  3. After you shampoo, rinse your hair and body thoroughly to remove the shampoo.  4. Use CHG as you would any other liquid soap. You can apply CHG directly to the skin and wash gently with a scrungie or a clean washcloth.   5. Apply the CHG Soap to your body ONLY FROM THE NECK DOWN.  Do not use on open wounds or open sores. Avoid contact with your eyes, ears, mouth and genitals (private parts). Wash Face and genitals (private parts)  with your normal soap.   6. Wash thoroughly, paying special attention to the area where your surgery will be performed.  7. Thoroughly rinse your body with warm water from the neck  down.  8. DO NOT shower/wash with your normal soap after using and rinsing off the CHG Soap.  9. Pat yourself dry with a CLEAN TOWEL.  10. Wear CLEAN PAJAMAS to bed the night before surgery, wear comfortable clothes the morning of surgery  11. Place CLEAN SHEETS on your bed the night of your first shower and DO NOT SLEEP WITH PETS.  Day of Surgery: Do not apply any deodorants/lotions. Please shower the morning of surgery with the CHG soap  Please wear clean clothes to the hospital/surgery center.   Remember to brush your teeth WITH YOUR REGULAR TOOTHPASTE.  Please read over the following fact sheets that you were given.

## 2018-12-12 ENCOUNTER — Other Ambulatory Visit (HOSPITAL_COMMUNITY)
Admission: RE | Admit: 2018-12-12 | Discharge: 2018-12-12 | Disposition: A | Discharge status: Home / Self Care | Payer: 59 | Source: Ambulatory Visit | Attending: Obstetrics & Gynecology | Admitting: Obstetrics & Gynecology

## 2018-12-12 ENCOUNTER — Encounter (HOSPITAL_COMMUNITY)
Admission: RE | Admit: 2018-12-12 | Discharge: 2018-12-12 | Disposition: A | Discharge status: Home / Self Care | Payer: 59 | Source: Ambulatory Visit | Attending: Obstetrics & Gynecology | Admitting: Obstetrics & Gynecology

## 2018-12-12 ENCOUNTER — Encounter (HOSPITAL_COMMUNITY): Payer: Self-pay

## 2018-12-12 ENCOUNTER — Other Ambulatory Visit: Payer: Self-pay

## 2018-12-12 DIAGNOSIS — Z20828 Contact with and (suspected) exposure to other viral communicable diseases: Secondary | ICD-10-CM | POA: Insufficient documentation

## 2018-12-12 DIAGNOSIS — Z01818 Encounter for other preprocedural examination: Secondary | ICD-10-CM | POA: Insufficient documentation

## 2018-12-12 HISTORY — DX: Essential (primary) hypertension: I10

## 2018-12-12 HISTORY — DX: Anemia, unspecified: D64.9

## 2018-12-12 HISTORY — DX: Sleep apnea, unspecified: G47.30

## 2018-12-12 LAB — BASIC METABOLIC PANEL
Anion gap: 12 (ref 5–15)
BUN: 14 mg/dL (ref 6–20)
CO2: 26 mmol/L (ref 22–32)
Calcium: 9.6 mg/dL (ref 8.9–10.3)
Chloride: 98 mmol/L (ref 98–111)
Creatinine, Ser: 0.83 mg/dL (ref 0.44–1.00)
GFR calc Af Amer: >60 mL/min (ref >60)
GFR calc non Af Amer: >60 mL/min (ref >60)
Glucose, Bld: 99 mg/dL (ref 70–99)
Potassium: 3.7 mmol/L (ref 3.5–5.1)
Sodium: 136 mmol/L (ref 135–145)

## 2018-12-12 LAB — CBC
HCT: 40.9 % (ref 36.0–46.0)
Hemoglobin: 11.7 g/dL — ABNORMAL LOW (ref 12.0–15.0)
MCH: 23.7 pg — ABNORMAL LOW (ref 26.0–34.0)
MCHC: 28.6 g/dL — ABNORMAL LOW (ref 30.0–36.0)
MCV: 83 fL (ref 80.0–100.0)
Platelets: 308 10*3/uL (ref 150–400)
RBC: 4.93 MIL/uL (ref 3.87–5.11)
RDW: 17.7 % — ABNORMAL HIGH (ref 11.5–15.5)
WBC: 7.4 10*3/uL (ref 4.0–10.5)
nRBC: 0 % (ref 0.0–0.2)

## 2018-12-12 NOTE — Progress Notes (Signed)
PCP:  Emeterio Reeve, DO Cardiologist:  Denies  EKG:  12/12/18 CXR:  N/A ECHO:  11/02/18 Stress Test:  Denies Cardiac Cath:  Denies  Covid testing today 12/12/18  Patient denies shortness of breath, fever, cough, and chest pain at PAT appointment.  Patient verbalized understanding of instructions provided today at the PAT appointment.  Patient asked to review instructions at home and day of surgery.

## 2018-12-13 LAB — NOVEL CORONAVIRUS, NAA (HOSP ORDER, SEND-OUT TO REF LAB; TAT 18-24 HRS): SARS-CoV-2, NAA: NOT DETECTED

## 2018-12-15 MED ORDER — DEXTROSE 5 % IV SOLN
3.0000 g | INTRAVENOUS | Status: AC
  Administered 2018-12-16: 3 g via INTRAVENOUS
  Filled 2018-12-15: qty 3000
  Filled 2018-12-15: qty 3

## 2018-12-15 NOTE — H&P (Signed)
Rachel Herrera is an 33 y.o. female with menorrhagia and AGUS Pap. 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 Difficult exam due to BMI but exam consistent with myoma at internal os. She is scheduled to have exam under anesthesia, transvaginal myomectomy, possible hysteroscopy and D&C.  Anemia, Obesity (BMI 50), HTN, Sleep apnea  No LMP recorded.    Past Medical History:  Diagnosis Date  . Anemia   . Depression   . Hypertension   . Sleep apnea     No past surgical history on file.  Family History  Problem Relation Age of Onset  . Hyperlipidemia Maternal Aunt   . Heart attack Maternal Uncle   . Heart attack Maternal Grandfather     Social History:  reports that she quit smoking about 3 years ago. She has never used smokeless tobacco. She reports current alcohol use. She reports that she does not use drugs.  Allergies: No Known Allergies  No medications prior to admission.    ROS neg  There were no vitals taken for this visit. Physical Exam Physical exam:  A&O x 3, no acute distress. Pleasant HEENT neg, no thyromegaly Lungs CTA bilat CV RRR, S1S2 normal Abdo soft, non tender, non acute Extr no edema/ tenderness Pelvic fibroid inside cervical os  CBC    Component Value Date/Time   WBC 7.4 12/12/2018 0825   RBC 4.93 12/12/2018 0825   HGB 11.7 (L) 12/12/2018 0825   HCT 40.9 12/12/2018 0825   PLT 308 12/12/2018 0825   MCV 83.0 12/12/2018 0825   MCH 23.7 (L) 12/12/2018 0825   MCHC 28.6 (L) 12/12/2018 0825   RDW 17.7 (H) 12/12/2018 0825   CMP     Component Value Date/Time   NA 136 12/12/2018 0825   K 3.7 12/12/2018 0825   CL 98 12/12/2018 0825   CO2 26 12/12/2018 0825   GLUCOSE 99 12/12/2018 0825   BUN 14 12/12/2018 0825   CREATININE 0.83 12/12/2018 0825   CALCIUM 9.6 12/12/2018 0825   GFRNONAA >60 12/12/2018 0825   GFRAA >60 12/12/2018 0825   Assessment/Plan: 33 yo female, here for vaginal myomectomy of prolapsing myoma into the cervix leading  to menorrhagia and pain.  Plan vaginal myomectomy, possible Myosure hysteroscopy and D&C. Risk of bleeding/ incomplete procedure and risk of infection, damage to uterus and cervix and need for further surgeries reviewed. Pt gives informed written consent.   Elveria Royals 12/16/2018

## 2018-12-16 ENCOUNTER — Encounter (HOSPITAL_COMMUNITY): Payer: Self-pay

## 2018-12-16 ENCOUNTER — Ambulatory Visit (HOSPITAL_COMMUNITY): Payer: 59 | Admitting: Vascular Surgery

## 2018-12-16 ENCOUNTER — Ambulatory Visit (HOSPITAL_COMMUNITY): Payer: 59

## 2018-12-16 ENCOUNTER — Encounter (HOSPITAL_COMMUNITY)
Admission: RE | Disposition: A | Discharge status: Home / Self Care | Payer: Self-pay | Source: Home / Self Care | Attending: Obstetrics & Gynecology

## 2018-12-16 ENCOUNTER — Other Ambulatory Visit: Payer: Self-pay

## 2018-12-16 ENCOUNTER — Ambulatory Visit (HOSPITAL_COMMUNITY)
Admission: RE | Admit: 2018-12-16 | Discharge: 2018-12-16 | Disposition: A | Discharge status: Home / Self Care | Payer: 59 | Attending: Obstetrics & Gynecology | Admitting: Obstetrics & Gynecology

## 2018-12-16 DIAGNOSIS — Z6841 Body Mass Index (BMI) 40.0 and over, adult: Secondary | ICD-10-CM | POA: Insufficient documentation

## 2018-12-16 DIAGNOSIS — N840 Polyp of corpus uteri: Secondary | ICD-10-CM | POA: Diagnosis not present

## 2018-12-16 DIAGNOSIS — D500 Iron deficiency anemia secondary to blood loss (chronic): Secondary | ICD-10-CM | POA: Diagnosis not present

## 2018-12-16 DIAGNOSIS — G4730 Sleep apnea, unspecified: Secondary | ICD-10-CM | POA: Insufficient documentation

## 2018-12-16 DIAGNOSIS — C531 Malignant neoplasm of exocervix: Secondary | ICD-10-CM | POA: Diagnosis present

## 2018-12-16 DIAGNOSIS — Z87891 Personal history of nicotine dependence: Secondary | ICD-10-CM | POA: Diagnosis not present

## 2018-12-16 DIAGNOSIS — I10 Essential (primary) hypertension: Secondary | ICD-10-CM | POA: Diagnosis not present

## 2018-12-16 HISTORY — PX: DILATATION & CURETTAGE/HYSTEROSCOPY WITH MYOSURE: SHX6511

## 2018-12-16 LAB — GLUCOSE, CAPILLARY: Glucose-Capillary: 107 mg/dL — ABNORMAL HIGH (ref 70–99)

## 2018-12-16 LAB — POCT PREGNANCY, URINE: Preg Test, Ur: NEGATIVE

## 2018-12-16 SURGERY — DILATATION & CURETTAGE/HYSTEROSCOPY WITH MYOSURE
Anesthesia: General | Site: Vagina

## 2018-12-16 MED ORDER — ONDANSETRON HCL 4 MG/2ML IJ SOLN
INTRAMUSCULAR | Status: AC
  Filled 2018-12-16: qty 2

## 2018-12-16 MED ORDER — ROCURONIUM BROMIDE 10 MG/ML (PF) SYRINGE
PREFILLED_SYRINGE | INTRAVENOUS | Status: DC | PRN
  Administered 2018-12-16: 60 mg via INTRAVENOUS

## 2018-12-16 MED ORDER — FENTANYL CITRATE (PF) 100 MCG/2ML IJ SOLN
INTRAMUSCULAR | Status: AC
  Filled 2018-12-16: qty 2

## 2018-12-16 MED ORDER — ONDANSETRON HCL 4 MG/2ML IJ SOLN
INTRAMUSCULAR | Status: DC | PRN
  Administered 2018-12-16: 4 mg via INTRAVENOUS

## 2018-12-16 MED ORDER — VASOPRESSIN 20 UNIT/ML IV SOLN
INTRAVENOUS | Status: AC
  Filled 2018-12-16: qty 1

## 2018-12-16 MED ORDER — DEXAMETHASONE SODIUM PHOSPHATE 10 MG/ML IJ SOLN
INTRAMUSCULAR | Status: DC | PRN
  Administered 2018-12-16: 10 mg via INTRAVENOUS

## 2018-12-16 MED ORDER — TRANEXAMIC ACID-NACL 1000-0.7 MG/100ML-% IV SOLN
INTRAVENOUS | Status: DC | PRN
  Administered 2018-12-16: 1000 mg via INTRAVENOUS

## 2018-12-16 MED ORDER — SODIUM CHLORIDE 0.9 % IV SOLN: INTRAVENOUS | Status: DC | PRN

## 2018-12-16 MED ORDER — FENTANYL CITRATE (PF) 100 MCG/2ML IJ SOLN
25.0000 ug | INTRAMUSCULAR | Status: DC | PRN
  Administered 2018-12-16: 09:00:00 50 ug via INTRAVENOUS

## 2018-12-16 MED ORDER — SUGAMMADEX SODIUM 500 MG/5ML IV SOLN
INTRAVENOUS | Status: AC
  Filled 2018-12-16: qty 5

## 2018-12-16 MED ORDER — TRANEXAMIC ACID 650 MG PO TABS: 1300.0000 mg | ORAL_TABLET | Freq: Three times a day (TID) | ORAL | 0 refills | Status: AC

## 2018-12-16 MED ORDER — OXYCODONE HCL 5 MG/5ML PO SOLN: 5.0000 mg | Freq: Once | ORAL | Status: DC | PRN

## 2018-12-16 MED ORDER — DEXAMETHASONE SODIUM PHOSPHATE 10 MG/ML IJ SOLN
INTRAMUSCULAR | Status: AC
  Filled 2018-12-16: qty 1

## 2018-12-16 MED ORDER — SODIUM CHLORIDE (PF) 0.9 % IJ SOLN
INTRAMUSCULAR | Status: DC | PRN
  Administered 2018-12-16: 30 mL

## 2018-12-16 MED ORDER — LACTATED RINGERS IV SOLN
INTRAVENOUS | Status: DC | PRN
  Administered 2018-12-16: 07:00:00 via INTRAVENOUS

## 2018-12-16 MED ORDER — LIDOCAINE 2% (20 MG/ML) 5 ML SYRINGE
INTRAMUSCULAR | Status: DC | PRN
  Administered 2018-12-16: 60 mg via INTRAVENOUS

## 2018-12-16 MED ORDER — TRANEXAMIC ACID-NACL 1000-0.7 MG/100ML-% IV SOLN
INTRAVENOUS | Status: AC
  Filled 2018-12-16: qty 100

## 2018-12-16 MED ORDER — MIDAZOLAM HCL 2 MG/2ML IJ SOLN
INTRAMUSCULAR | Status: DC | PRN
  Administered 2018-12-16: 2 mg via INTRAVENOUS

## 2018-12-16 MED ORDER — MIDAZOLAM HCL 2 MG/2ML IJ SOLN
INTRAMUSCULAR | Status: AC
  Filled 2018-12-16: qty 2

## 2018-12-16 MED ORDER — KETOROLAC TROMETHAMINE 15 MG/ML IJ SOLN
INTRAMUSCULAR | Status: AC
  Filled 2018-12-16: qty 2

## 2018-12-16 MED ORDER — FERROUS SULFATE 325 (65 FE) MG PO TABS: 325.0000 mg | ORAL_TABLET | Freq: Every day | ORAL | 0 refills | Status: DC

## 2018-12-16 MED ORDER — OXYCODONE HCL 5 MG PO TABS: 5.0000 mg | ORAL_TABLET | Freq: Once | ORAL | Status: DC | PRN

## 2018-12-16 MED ORDER — KETOROLAC TROMETHAMINE 30 MG/ML IJ SOLN
30.0000 mg | Freq: Once | INTRAMUSCULAR | Status: AC
  Administered 2018-12-16: 07:00:00 30 mg via INTRAVENOUS

## 2018-12-16 MED ORDER — LIDOCAINE 2% (20 MG/ML) 5 ML SYRINGE
INTRAMUSCULAR | Status: AC
  Filled 2018-12-16: qty 5

## 2018-12-16 MED ORDER — VASOPRESSIN 20 UNIT/ML IV SOLN
INTRAVENOUS | Status: DC | PRN
  Administered 2018-12-16: 20 [IU]

## 2018-12-16 MED ORDER — SUGAMMADEX SODIUM 200 MG/2ML IV SOLN
INTRAVENOUS | Status: DC | PRN
  Administered 2018-12-16: 317.6 mg via INTRAVENOUS

## 2018-12-16 MED ORDER — PROPOFOL 10 MG/ML IV BOLUS
INTRAVENOUS | Status: AC
  Filled 2018-12-16: qty 40

## 2018-12-16 MED ORDER — ROCURONIUM BROMIDE 10 MG/ML (PF) SYRINGE
PREFILLED_SYRINGE | INTRAVENOUS | Status: AC
  Filled 2018-12-16: qty 10

## 2018-12-16 MED ORDER — SUCCINYLCHOLINE CHLORIDE 200 MG/10ML IV SOSY
PREFILLED_SYRINGE | INTRAVENOUS | Status: DC | PRN
  Administered 2018-12-16: 14 mg via INTRAVENOUS

## 2018-12-16 MED ORDER — ONDANSETRON HCL 4 MG/2ML IJ SOLN: 4.0000 mg | Freq: Four times a day (QID) | INTRAMUSCULAR | Status: DC | PRN

## 2018-12-16 MED ORDER — FENTANYL CITRATE (PF) 250 MCG/5ML IJ SOLN
INTRAMUSCULAR | Status: AC
  Filled 2018-12-16: qty 5

## 2018-12-16 MED ORDER — LIDOCAINE HCL 1 % IJ SOLN
INTRAMUSCULAR | Status: AC
  Filled 2018-12-16: qty 20

## 2018-12-16 MED ORDER — PROPOFOL 10 MG/ML IV BOLUS
INTRAVENOUS | Status: DC | PRN
  Administered 2018-12-16: 170 mg via INTRAVENOUS

## 2018-12-16 MED ORDER — SUCCINYLCHOLINE CHLORIDE 200 MG/10ML IV SOSY
PREFILLED_SYRINGE | INTRAVENOUS | Status: AC
  Filled 2018-12-16: qty 10

## 2018-12-16 MED ORDER — FENTANYL CITRATE (PF) 250 MCG/5ML IJ SOLN
INTRAMUSCULAR | Status: DC | PRN
  Administered 2018-12-16: 100 ug via INTRAVENOUS

## 2018-12-16 SURGICAL SUPPLY — 23 items
CANISTER SUCT 3000ML PPV (MISCELLANEOUS) ×3 IMPLANT
CATH ROBINSON RED A/P 16FR (CATHETERS) IMPLANT
CNTNR SPEC C3OZ STD GRAD LEK (MISCELLANEOUS) ×1 IMPLANT
CONT SPEC 3OZ W/LID STRL (MISCELLANEOUS) ×2
CONT SPEC 4OZ CLIKSEAL STRL BL (MISCELLANEOUS) ×3 IMPLANT
DEVICE MYOSURE LITE (MISCELLANEOUS) IMPLANT
DEVICE MYOSURE REACH (MISCELLANEOUS) IMPLANT
GAUZE 4X4 16PLY RFD (DISPOSABLE) ×3 IMPLANT
GLOVE BIO SURGEON STRL SZ7 (GLOVE) ×3 IMPLANT
GLOVE BIOGEL PI IND STRL 7.0 (GLOVE) ×2 IMPLANT
GLOVE BIOGEL PI INDICATOR 7.0 (GLOVE) ×4
GOWN STRL REUS W/ TWL LRG LVL3 (GOWN DISPOSABLE) ×2 IMPLANT
GOWN STRL REUS W/TWL LRG LVL3 (GOWN DISPOSABLE) ×4
KIT PROCEDURE FLUENT (KITS) IMPLANT
KIT TURNOVER KIT B (KITS) ×3 IMPLANT
PACK VAGINAL MINOR WOMEN LF (CUSTOM PROCEDURE TRAY) ×3 IMPLANT
PAD OB MATERNITY 4.3X12.25 (PERSONAL CARE ITEMS) ×3 IMPLANT
SEAL ROD LENS SCOPE MYOSURE (ABLATOR) IMPLANT
TOWEL GREEN STERILE FF (TOWEL DISPOSABLE) ×6 IMPLANT
TUBE CONNECTING 20'X1/4 (TUBING) ×1
TUBE CONNECTING 20X1/4 (TUBING) ×2 IMPLANT
UNDERPAD 30X36 HEAVY ABSORB (UNDERPADS AND DIAPERS) ×3 IMPLANT
YANKAUER SUCT BULB TIP NO VENT (SUCTIONS) ×3 IMPLANT

## 2018-12-16 NOTE — Transfer of Care (Signed)
Immediate Anesthesia Transfer of Care Note  Patient: Rachel Herrera  Procedure(s) Performed: DILATATION & CURETTAGE, Vaginal Partial Myomectomy Prolapsing Cervical  Myoma (N/A Vagina )  Patient Location: PACU  Anesthesia Type:General  Level of Consciousness: awake, patient cooperative and responds to stimulation  Airway & Oxygen Therapy: Patient Spontanous Breathing and Patient connected to nasal cannula oxygen  Post-op Assessment: Report given to RN, Post -op Vital signs reviewed and stable and Patient moving all extremities X 4  Post vital signs: Reviewed and stable  Last Vitals:  Vitals Value Taken Time  BP 113/86 12/16/18 0901  Temp    Pulse 79 12/16/18 0907  Resp 11 12/16/18 0907  SpO2 100 % 12/16/18 0907  Vitals shown include unvalidated device data.  Last Pain:  Vitals:   12/16/18 0900  TempSrc:   PainSc: (P) 5          Complications: No apparent anesthesia complications

## 2018-12-16 NOTE — Op Note (Signed)
Preoperative diagnosis: Menorrhagia, anemia, prolapsing cervical myoma, obesity Postop diagnosis: as above.  Procedure: Vaginal myoma resection (partial) and endometrial curettage  Anesthesia General endotracheal. Surgeon: Azucena Fallen, MD Assistant: none  IV fluids 700 cc LR Estimated blood loss 300 cc Urine output:  Voided before moving back to OR Complications none Condition stable Disposition PACU  Specimen: Fragments of resected myoma, endometrial curettings   Procedure  Indication: Severe anemia from menorrhagia. Office sono and exam consistent with cervical fibroid with wide base with protrusion through cervical os and bleeding.  Decision was made to proceed with exam under anesthesia and myoma excision vaginally, endometrial curettage and hysteroscopy if myoma stalk was higher in the endometrial cavity.  Risks/ complications of surgery including infection, bleeding, damage to internal organs including uterine perforation, cervical laceration, Asherman syndrome were reviewed. She voiced understanding and gave informed written consent.  Patient was brought to the operating room with IV running. She received preop 3 gm Ancef due to cervix being dilated for several weeks and exposure to vaginal flora. She underwent general endotracheal anesthesia without complications. She was given dorsolithotomy position. Parts were prepped and draped in standard fashion.  Bimanual exam revealed 2 cm dilated cervix with myoma just inside external os.  Weighted speculum was placed and cervix was grasped with single-tooth tenaculum. Bleeding was profuse. 20 units Pitressin with 30 cc saline - of that 20 cc used for paracervical block to reduce bleeding. Also TXA 1000 mg IV given and bleeding decreased significantly. Cervical fibroid was removed in pieces with Ring forceps as much as possible. Curettage of endometrial cavity done to get a sample since it was not possible in office (due to AGUS Pap and morbid  obesity- need endometrial sampling). Myoma was debulked to less than half its size but the wide stalk didn't allow complete excision. At this point procedure stopped, bleeding was minimal.  All counts are correct x2. Specimen products of conception. No complications. She was brought to PACU stable and extubated.   Patient will be discharged home. Will take Lysteda 2 tabs every 8 hours for 4 days. Will be referred to IR for cervical fibroid embolization if amenable.  Dc follow up in 4 weeks in office.  Warning signs of infection and excessive bleeding reviewed.  V.Tysha Grismore, MD.

## 2018-12-16 NOTE — Anesthesia Preprocedure Evaluation (Addendum)
Anesthesia Evaluation  Patient identified by MRN, date of birth, ID band Patient awake    Reviewed: Allergy & Precautions, H&P , NPO status , Patient's Chart, lab work & pertinent test results  Airway Mallampati: II   Neck ROM: full    Dental   Pulmonary sleep apnea , Patient abstained from smoking., former smoker,    breath sounds clear to auscultation       Cardiovascular hypertension,  Rhythm:regular Rate:Normal     Neuro/Psych PSYCHIATRIC DISORDERS Depression    GI/Hepatic   Endo/Other    Renal/GU      Musculoskeletal   Abdominal   Peds  Hematology   Anesthesia Other Findings   Reproductive/Obstetrics                             Anesthesia Physical Anesthesia Plan  ASA: II  Anesthesia Plan: General   Post-op Pain Management:    Induction: Intravenous  PONV Risk Score and Plan: 3 and Ondansetron, Dexamethasone, Midazolam and Treatment may vary due to age or medical condition  Airway Management Planned: Oral ETT  Additional Equipment:   Intra-op Plan:   Post-operative Plan: Extubation in OR  Informed Consent: I have reviewed the patients History and Physical, chart, labs and discussed the procedure including the risks, benefits and alternatives for the proposed anesthesia with the patient or authorized representative who has indicated his/her understanding and acceptance.       Plan Discussed with: CRNA, Anesthesiologist and Surgeon  Anesthesia Plan Comments:         Anesthesia Quick Evaluation

## 2018-12-16 NOTE — Anesthesia Procedure Notes (Signed)
Procedure Name: Intubation Date/Time: 12/16/2018 7:51 AM Performed by: Verdie Drown, CRNA Pre-anesthesia Checklist: Patient identified, Emergency Drugs available, Suction available and Patient being monitored Patient Re-evaluated:Patient Re-evaluated prior to induction Oxygen Delivery Method: Circle System Utilized Preoxygenation: Pre-oxygenation with 100% oxygen Induction Type: IV induction and Cricoid Pressure applied Ventilation: Mask ventilation without difficulty Laryngoscope Size: Mac and 3 Grade View: Grade I Tube type: Oral Tube size: 7.5 mm Number of attempts: 1 Airway Equipment and Method: Stylet and Oral airway Placement Confirmation: ETT inserted through vocal cords under direct vision,  positive ETCO2 and breath sounds checked- equal and bilateral Secured at: 21 cm Tube secured with: Tape Dental Injury: Teeth and Oropharynx as per pre-operative assessment

## 2018-12-16 NOTE — Discharge Instructions (Signed)
Dr Sabryna Lahm's office will make referral for you to see Interventional Radiologist for cervical fibroid embolization option if that is an option, they can proceed. You may see some white tissue pieces coming out with vaginal bleeding, those are fragments of fibroids, you do not need to collect them.

## 2018-12-17 ENCOUNTER — Encounter (HOSPITAL_COMMUNITY): Payer: Self-pay | Admitting: Obstetrics & Gynecology

## 2018-12-17 LAB — SURGICAL PATHOLOGY

## 2018-12-17 NOTE — Anesthesia Postprocedure Evaluation (Signed)
Anesthesia Post Note  Patient: Rachel Herrera  Procedure(s) Performed: DILATATION & CURETTAGE, Vaginal Partial Myomectomy Prolapsing Cervical  Myoma (N/A Vagina )     Patient location during evaluation: PACU Anesthesia Type: General Level of consciousness: awake and alert Pain management: pain level controlled Vital Signs Assessment: post-procedure vital signs reviewed and stable Respiratory status: spontaneous breathing, nonlabored ventilation, respiratory function stable and patient connected to nasal cannula oxygen Cardiovascular status: blood pressure returned to baseline and stable Postop Assessment: no apparent nausea or vomiting Anesthetic complications: no    Last Vitals:  Vitals:   12/16/18 0930 12/16/18 0945  BP: 140/63 140/66  Pulse: 79 79  Resp: 13 (!) 22  Temp:    SpO2: 97% 98%    Last Pain:  Vitals:   12/16/18 0930  TempSrc:   PainSc: [name]

## 2018-12-19 ENCOUNTER — Telehealth: Payer: Self-pay | Admitting: *Deleted

## 2018-12-19 NOTE — Telephone Encounter (Signed)
Called the patient, gave the appt for 11/24. Gave the patient address and phone number; along with the policy for parking, mask and visitors

## 2018-12-22 ENCOUNTER — Other Ambulatory Visit: Payer: Self-pay | Admitting: Obstetrics & Gynecology

## 2018-12-22 DIAGNOSIS — C55 Malignant neoplasm of uterus, part unspecified: Secondary | ICD-10-CM

## 2018-12-24 ENCOUNTER — Ambulatory Visit
Admission: RE | Admit: 2018-12-24 | Discharge: 2018-12-24 | Disposition: A | Discharge status: Home / Self Care | Payer: 59 | Source: Ambulatory Visit | Attending: Obstetrics & Gynecology | Admitting: Obstetrics & Gynecology

## 2018-12-24 ENCOUNTER — Other Ambulatory Visit: Payer: Self-pay | Admitting: Obstetrics & Gynecology

## 2018-12-24 ENCOUNTER — Other Ambulatory Visit: Payer: Self-pay

## 2018-12-24 DIAGNOSIS — C55 Malignant neoplasm of uterus, part unspecified: Secondary | ICD-10-CM

## 2018-12-24 MED ORDER — IOPAMIDOL (ISOVUE-300) INJECTION 61%
125.0000 mL | Freq: Once | INTRAVENOUS | Status: AC | PRN
  Administered 2018-12-24: 125 mL via INTRAVENOUS

## 2018-12-30 ENCOUNTER — Inpatient Hospital Stay: Payer: 59 | Attending: Gynecologic Oncology | Admitting: Gynecologic Oncology

## 2018-12-30 ENCOUNTER — Encounter: Payer: Self-pay | Admitting: Gynecologic Oncology

## 2018-12-30 ENCOUNTER — Telehealth: Payer: Self-pay | Admitting: *Deleted

## 2018-12-30 ENCOUNTER — Other Ambulatory Visit: Payer: Self-pay

## 2018-12-30 VITALS — BP 138/77 | HR 101 | Temp 98.2°F | Resp 18 | Ht 69.5 in | Wt 340.3 lb

## 2018-12-30 DIAGNOSIS — N888 Other specified noninflammatory disorders of cervix uteri: Secondary | ICD-10-CM

## 2018-12-30 DIAGNOSIS — I10 Essential (primary) hypertension: Secondary | ICD-10-CM | POA: Diagnosis not present

## 2018-12-30 DIAGNOSIS — Z6841 Body Mass Index (BMI) 40.0 and over, adult: Secondary | ICD-10-CM | POA: Insufficient documentation

## 2018-12-30 DIAGNOSIS — F329 Major depressive disorder, single episode, unspecified: Secondary | ICD-10-CM | POA: Insufficient documentation

## 2018-12-30 DIAGNOSIS — C541 Malignant neoplasm of endometrium: Secondary | ICD-10-CM | POA: Insufficient documentation

## 2018-12-30 DIAGNOSIS — Z79899 Other long term (current) drug therapy: Secondary | ICD-10-CM | POA: Insufficient documentation

## 2018-12-30 DIAGNOSIS — C55 Malignant neoplasm of uterus, part unspecified: Secondary | ICD-10-CM | POA: Diagnosis present

## 2018-12-30 MED ORDER — SENNOSIDES-DOCUSATE SODIUM 8.6-50 MG PO TABS: 2.0000 | ORAL_TABLET | Freq: Every day | ORAL | 1 refills | Status: DC

## 2018-12-30 MED ORDER — IBUPROFEN 800 MG PO TABS: 800.0000 mg | ORAL_TABLET | Freq: Three times a day (TID) | ORAL | 1 refills | Status: DC | PRN

## 2018-12-30 MED ORDER — OXYCODONE HCL 5 MG PO TABS: 5.0000 mg | ORAL_TABLET | ORAL | 0 refills | Status: DC | PRN

## 2018-12-30 NOTE — Patient Instructions (Signed)
Preparing for your Surgery  Plan for surgery on February 17, 2019 with Dr. Everitt Amber at [place] will be scheduled for a robotic assisted total laparoscopic hysterectomy, bilateral salpingectomy, sentinel lymph node biopsy.   Pre-operative Testing -You will receive a phone call from presurgical testing at [facility] if you have not received a call already to arrange for a pre-operative testing appointment before your surgery.  This appointment normally occurs one to two weeks before your scheduled surgery.   -Bring your insurance card, copy of an advanced directive if applicable, medication list  -At that visit, you will be asked to sign a consent for a possible blood transfusion in case a transfusion becomes necessary during surgery.  The need for a blood transfusion is rare but having consent is a necessary part of your care.     -You should not be taking blood thinners or aspirin at least ten days prior to surgery unless instructed by your surgeon.  -Do not take supplements such as fish oil (omega 3), red yeast rice, tumeric before your surgery.  Day Before Surgery at [facility] will be asked to take in a light diet the day before surgery.  Avoid carbonated beverages.  You will be advised to have nothing to eat or drink after midnight the evening before.    Eat a light diet the day before surgery.  Examples including soups, broths, toast, yogurt, mashed potatoes.  Things to avoid include carbonated beverages (fizzy beverages), raw fruits and raw vegetables, or beans.   If your bowels are filled with gas, your surgeon will have difficulty visualizing your pelvic organs which increases your surgical risks.  Your role in recovery Your role is to become active as soon as directed by your doctor, while still giving yourself time to heal.  Rest when you feel tired. You will be asked to do the following in order to speed your recovery:  - Cough and breathe deeply. This  helps toclear and expand your lungs and can prevent pneumonia.  - Do mild physical activity. Walking or moving your legs help your circulation and body functions return to normal. A staff member will help you when you try to walk and will provide you with simple exercises. Do not try to get up or walk alone the first time. - Actively manage your pain. Managing your pain lets you move in comfort. We will ask you to rate your pain on a scale of zero to 10. It is your responsibility to tell your doctor or nurse where and how much you hurt so your pain can be treated.  Special Considerations -If you are diabetic, you may be placed on insulin after surgery to have closer control over your blood sugars to promote healing and recovery.  This does not mean that you will be discharged on insulin.  If applicable, your oral antidiabetics will be resumed when you are tolerating a solid diet.  -Your final pathology results from surgery should be available around one week after surgery and the results will be relayed to you when available.  -Dr. Lahoma Crocker is the surgeon that assists your GYN Oncologist with surgery.  If you end up staying the night, the next day after your surgery you will either see Dr. Denman George or Dr. Lahoma Crocker.  -FMLA forms can be faxed to [phone] and please allow 5-7 business days for completion.  Pain Management After Surgery -You have been prescribed your pain medication and bowel regimen  medications before surgery so that you can have these available when you are discharged from the hospital. The pain medication is for use ONLY AFTER surgery and a new prescription will not be given.   -Make sure that you have Tylenol and Ibuprofen at home to use on a regular basis after surgery for pain control. We recommend alternating the medications every hour to six hours since they work differently and are processed in the body differently for pain relief.  -Review the attached  handout on narcotic use and their risks and side effects.   Bowel Regimen -You have been prescribed Sennakot-S to take nightly to prevent constipation especially if you are taking the narcotic pain medication intermittently.  It is important to prevent constipation and drink adequate amounts of liquids.  Blood Transfusion Information WHAT IS A BLOOD TRANSFUSION? A transfusion is the replacement of blood or some of its parts. Blood is made up of multiple cells which provide different functions.  Red blood cells carry oxygen and are used for blood loss replacement.  White blood cells fight against infection.  Platelets control bleeding.  Plasma helps clot blood.  Other blood products are available for specialized needs, such as hemophilia or other clotting disorders. BEFORE THE TRANSFUSION  Who gives blood for transfusions?   You may be able to donate blood to be used at a later date on yourself (autologous donation).  Relatives can be asked to donate blood. This is generally not any safer than if you have received blood from a stranger. The same precautions are taken to ensure safety when a relative's blood is donated.  Healthy volunteers who are fully evaluated to make sure their blood is safe. This is blood bank blood. Transfusion therapy is the safest it has ever been in the practice of medicine. Before blood is taken from a donor, a complete history is taken to make sure that person has no history of diseases nor engages in risky social behavior (examples are intravenous drug use or sexual activity with multiple partners). The donor's travel history is screened to minimize risk of transmitting infections, such as malaria. The donated blood is tested for signs of infectious diseases, such as HIV and hepatitis. The blood is then tested to be sure it is compatible with you in order to minimize the chance of a transfusion reaction. If you or a relative donates blood, this is often done in  anticipation of surgery and is not appropriate for emergency situations. It takes many days to process the donated blood. RISKS AND COMPLICATIONS Although transfusion therapy is very safe and saves many lives, the main dangers of transfusion include:   Getting an infectious disease.  Developing a transfusion reaction. This is an allergic reaction to something in the blood you were given. Every precaution is taken to prevent this. The decision to have a blood transfusion has been considered carefully by your caregiver before blood is given. Blood is not given unless the benefits outweigh the risks.  AFTER SURGERY INSTRUCTIONS 12/30/2018  Return to work: 4-6 weeks if applicable  Activity: 1. Be up and out of the bed during the day.  Take a nap if needed.  You may walk up steps but be careful and use the hand rail.  Stair climbing will tire you more than you think, you may need to stop part way and rest.   2. No lifting or straining for 6 weeks. Nothing heavier than 10 lbs and/or nothing you would have to pick up  with two hands.  3. No driving for 1 week(s).  Do not drive if you are taking narcotic pain medicine.  4. Shower daily.  Use soap and water on your incision and pat dry; don't rub.  No tub baths until cleared by your surgeon.   5. No sexual activity and nothing in the vagina for 8 weeks.  6. You may experience a small amount of clear drainage from your incisions, which is normal.  If the drainage persists or increases, please call the office.  7. You may experience vaginal spotting after surgery or around the 6-8 week mark from surgery when the stitches at the top of the vagina begin to dissolve.  The spotting is normal but if you experience heavy bleeding, call our office.  8. Take Tylenol or ibuprofen first for pain and only use narcotic pain medication for severe pain not relieved by the Tylenol or Ibuprofen.  Monitor your Tylenol intake to a max of 4,000 mg.  Diet: 1. Low  sodium Heart Healthy Diet is recommended.  2. It is safe to use a laxative, such as Miralax or Colace, if you have difficulty moving your bowels. You can take Sennakot at bedtime every evening to keep bowel movements regular and to prevent constipation.    Wound Care: 1. Keep clean and dry.  Shower daily.  Reasons to call the Doctor:  Fever - Oral temperature greater than 100.4 degrees Fahrenheit  Foul-smelling vaginal discharge  Difficulty urinating  Nausea and vomiting  Increased pain at the site of the incision that is unrelieved with pain medicine.  Difficulty breathing with or without chest pain  New calf pain especially if only on one side  Sudden, continuing increased vaginal bleeding with or without clots.   Contacts: For questions or concerns you should contact:  Dr. Everitt Amber at [phone]  Joylene John, NP at [phone]  After Hours: call [phone] and have the GYN Oncologist paged/contacted

## 2018-12-30 NOTE — Progress Notes (Signed)
Consult Note: Gyn-Onc  Consult was requested by Dr. Benjie Karvonen for the evaluation of Rachel Herrera 33 y.o. female  CC:  Chief Complaint  Patient presents with  . Malignant neoplasm of uterus, unspecified site [facility])    Assessment/Plan:  Rachel. Rachel Herrera  is a 33 y.o.  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  Her tumor was apparently prolapsing from either the internal awes of the cervix to lower uterine segment.  We will obtain an MRI of the pelvis to better delineate the origins of this tumor.  A deeply invasive cervical stromal tumor may necessitate a radical hysterectomy procedure including a unilateral radical dissection if necessary for adequate tumor clearance.  Additionally we will use the MRI to evaluate the ovaries.  Is grossly normal on MRI and free of deep cervical stromal involvement of concerning extrauterine disease we will spare the ovaries.  I discussed that given the location of her tumor I did not expect that fertility preserving options would be possible or safe for her.  She was in agreement with this and excepting of hysterectomy.  I am recommending robotic assisted total hysterectomy with Bilateral salpingectomy, (possible oophorectomy) and sentinel lymph node biopsy.  I explained that if mapping is not successful complete lymphadenectomy will be performed which carries with it the risk of lymphedema.  I explained that if there is significant cervical stromal involvement or extrauterine disease identified, she would be a candidate for adjuvant treatment such as radiation and/or chemotherapy postoperatively.  If ovaries are retained, pelvic radiation and possibly chemotherapy could be sterilizing with respect to ovarian function.  She understood that hysterectomy would result in permanent infertility.  I explained that if radical hysterectomy is necessary this can result in  permanent bladder dysfunction.  She would require a Foley postoperatively if this was necessary.        HPI: Rachel Herrera is a 33 year old P0 who was seen in consultation at the request of Dr Benjie Karvonen for evaluation of FIGO grade 1 endometrial cancer.   The patient reported a longstanding history of menorrhagia with irregular heavy periods.  This culminated in admission to [facility]) where she was diagnosed with severe anemia, required ICU admission and transfusion of 9 units of PRBC for what she thought was a Hb of 2.9.  She had not seen a gynecologist for approximately 10 years, but after this discharge was seen by Dr Benjie Karvonen in October, 2020. At that time a pap was performed on 11/23/18 which revealed atypical glandular cells.  An ultrasound was performed on 12/02/18 which showed a 5.5 x 3.9x 3.3cm fibroid along the posterior cervical edge extending into the endocervical canal. The fundal endometrium was also thickened. The ovaries were grossly normal.   She was taken to the OR on 12/16/18 for vaginal myomectomy.  Intraoperative findings were significant for a myoma delivering through the cervix that was debulked to less than half its size but with a wide stalk could not facilitate complete excision.  She had minimal bleeding postoperatively.  Final pathology returned as a FIGO grade 1 endometrioid adenocarcinoma.  CT scan of the abdomen and pelvis was performed on December 24, 2018 which revealed mildly enlarged left iliac chain lymph node measuring 16 mm.  There was a mildly enlarged portacaval lymph node measuring 17 mm.  They were felt to be likely reactive.  The uterus was anteverted and grossly unremarkable.  The ovaries were also grossly unremarkable.  The patient's medical history is not remarkable as she has had limited medical care.  She has morbid obesity with a BMI of 50 kg meters squared.  She denies prior testing for diabetes mellitus.  She has had some mild  hypertension.  She is never been pregnant.  She is ambivalent about childbearing.  She is married to a man from [place].  She has no family history significant for gynecologic cancers.  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  Current Meds:  Outpatient Encounter Medications as of 12/30/2018  Medication Sig  . Melatonin 5 MG CAPS Take 5 mg by mouth at bedtime as needed.  . AMBULATORY NON FORMULARY MEDICATION Supply ordered: CPAP and other supplies needed (headgear, cushions, filters, heated tuubing and water chamber) Dx: obstructive sleep apnea Settings: auto-titration 5-20 cmH2O  . Cholecalciferol (VITAMIN D) 125 MCG (5000 UT) CAPS Take 5,000 Units by mouth every other day.  . ferrous sulfate (FERROUSUL) 325 (65 FE) MG tablet Take 1 tablet (325 mg total) by mouth daily with breakfast.  . hydrochlorothiazide (HYDRODIURIL) 25 MG tablet Take 1 tablet (25 mg total) by mouth daily.  Marland Kitchen ibuprofen (ADVIL) 800 MG tablet Take 1 tablet (800 mg total) by mouth every 8 (eight) hours as needed for moderate pain. For AFTER surgery  . oxyCODONE (OXY IR/ROXICODONE) 5 MG immediate release tablet Take 1 tablet (5 mg total) by mouth every 4 (four) hours as needed for severe pain. For AFTER surgery only, do not take and drive  . senna-docusate (SENOKOT-S) 8.6-50 MG tablet Take 2 tablets by mouth at bedtime. For AFTER surgery, do not take if having diarrhea  . [DISCONTINUED] ibuprofen (ADVIL) 200 MG tablet Take 400 mg by mouth every 6 (six) hours as needed for moderate pain.    No facility-administered encounter medications on file as of 12/30/2018.     Allergy: No Known Allergies  Social Hx:   Social History   Socioeconomic History  . Marital status: Married    Spouse name: Not on file  . Number of children: Not on file  . Years of education: Not on file  . Highest education level: Not on file  Occupational History  . Not on file  Social Needs  . Financial resource strain: Not on file   . Food insecurity    Worry: Not on file    Inability: Not on file  . Transportation needs    Medical: Not on file    Non-medical: Not on file  Tobacco Use  . Smoking status: Former Smoker    Quit date: 12/12/2015    Years since quitting: 3.0  . Smokeless tobacco: Never Used  Substance and Sexual Activity  . Alcohol use: Yes    Comment: weekends  . Drug use: No  . Sexual activity: Yes    Birth control/protection: Condom  Lifestyle  . Physical activity    Days per week: Not on file    Minutes per session: Not on file  . Stress: Not on file  Relationships  . Social Herbalist on phone: Not on file    Gets together: Not on file    Attends religious service: Not on file    Active member of club or organization: Not on file    Attends meetings of clubs or organizations: Not on file    Relationship status: Not on file  . Intimate partner violence    Fear of current  or ex partner: Not on file    Emotionally abused: Not on file    Physically abused: Not on file    Forced sexual activity: Not on file  Other Topics Concern  . Not on file  Social History Narrative  . Not on file    Past Surgical Hx:  Past Surgical History:  Procedure Laterality Date  . DILATATION & CURETTAGE/HYSTEROSCOPY WITH MYOSURE N/A 12/16/2018   Procedure: DILATATION & CURETTAGE, Vaginal Partial Myomectomy Prolapsing Cervical  Myoma;  Surgeon: Azucena Fallen, MD;  Location: [place];  Service: Gynecology;  Laterality: N/A;    Past Medical Hx:  Past Medical History:  Diagnosis Date  . Anemia   . Depression   . Hypertension   . Sleep apnea     Past Gynecological History:  See HPI No LMP recorded.  Family Hx:  Family History  Problem Relation Age of Onset  . Hyperlipidemia Maternal Aunt   . Cervical cancer Maternal Aunt   . Heart attack Maternal Uncle   . Heart attack Maternal Grandfather   . Lymphoma Cousin     Review of Systems:  Constitutional  Feels well,    ENT Normal  appearing ears and nares bilaterally Skin/Breast  No rash, sores, jaundice, itching, dryness Cardiovascular  No chest pain, shortness of breath, or edema  Pulmonary  No cough or wheeze.  Gastro Intestinal  No nausea, vomitting, or diarrhoea. No bright red blood per rectum, no abdominal pain, change in bowel movement, or constipation.  Genito Urinary  No frequency, urgency, dysuria, + bleeding Musculo Skeletal  No myalgia, arthralgia, joint swelling or pain  Neurologic  No weakness, numbness, change in gait,  Psychology  No depression, anxiety, insomnia.   Vitals:  Blood pressure 138/77, pulse (!) 101, temperature 98.2 F (36.8 C), temperature source Temporal, resp. rate 18, height 5' 9.5" (1.765 m), weight (!) 340 lb 4.8 oz (154.4 kg), SpO2 97 %.  Physical Exam: WD in NAD Neck  Supple NROM, without any enlargements.  Lymph Node Survey No cervical supraclavicular or inguinal adenopathy Cardiovascular  Pulse normal rate, regularity and rhythm. S1 and S2 normal.  Lungs  Clear to auscultation bilateraly, without wheezes/crackles/rhonchi. Good air movement.  Skin  No rash/lesions/breakdown  Psychiatry  Alert and oriented to person, place, and time  Abdomen  Normoactive bowel sounds, abdomen soft, non-tender and obese without evidence of hernia.  Back No CVA tenderness Genito Urinary  Vulva/vagina: Normal external female genitalia.   No lesions. No discharge or bleeding.  Bladder/urethra:  No lesions or masses, well supported bladder  Vagina: no lesions  Cervix: soft, not enlarged, slightly dilated  Uterus: difficult to appreciate size due to body habitus. Some fullness in the left lower uterine segment appreciated, mobile, no parametrial involvement or nodularity.  Adnexa: no palpable masses. Rectal  deferred Extremities  No bilateral cyanosis, clubbing or edema.   Thereasa Solo, MD  12/30/2018, 12:47 PM

## 2018-12-30 NOTE — Telephone Encounter (Signed)
Error

## 2019-01-05 ENCOUNTER — Other Ambulatory Visit: Payer: Self-pay | Admitting: Gynecologic Oncology

## 2019-01-05 DIAGNOSIS — C541 Malignant neoplasm of endometrium: Secondary | ICD-10-CM

## 2019-01-06 ENCOUNTER — Encounter: Payer: Self-pay | Admitting: Gynecologic Oncology

## 2019-01-06 NOTE — Progress Notes (Signed)
Peer to peer performed for patient's MRI.  Approved through Jan 15.  Auth# [id number]

## 2019-01-19 ENCOUNTER — Ambulatory Visit (HOSPITAL_COMMUNITY)
Admission: RE | Admit: 2019-01-19 | Discharge: 2019-01-19 | Disposition: A | Discharge status: Home / Self Care | Payer: 59 | Source: Ambulatory Visit | Attending: Gynecologic Oncology | Admitting: Gynecologic Oncology

## 2019-01-19 ENCOUNTER — Other Ambulatory Visit: Payer: Self-pay

## 2019-01-19 DIAGNOSIS — C541 Malignant neoplasm of endometrium: Secondary | ICD-10-CM | POA: Diagnosis present

## 2019-01-19 DIAGNOSIS — C55 Malignant neoplasm of uterus, part unspecified: Secondary | ICD-10-CM

## 2019-01-19 DIAGNOSIS — N888 Other specified noninflammatory disorders of cervix uteri: Secondary | ICD-10-CM | POA: Diagnosis present

## 2019-01-19 MED ORDER — GADOBUTROL 1 MMOL/ML IV SOLN
12.0000 mL | Freq: Once | INTRAVENOUS | Status: AC | PRN
  Administered 2019-01-19: 12 mL via INTRAVENOUS

## 2019-01-23 ENCOUNTER — Telehealth: Payer: Self-pay

## 2019-01-23 NOTE — Telephone Encounter (Signed)
Told [name] that the MRI shows the uterine mass that extends to the cervix and enlarged lymph nodes that are indeterminate.Her case will be discussed at tumor board on 02-02-19 and will let her know recommendations per Joylene John, NP.  Pt verbalized understanding.

## 2019-02-02 ENCOUNTER — Other Ambulatory Visit: Payer: Self-pay | Admitting: Oncology

## 2019-02-02 NOTE — Progress Notes (Signed)
Gynecologic Oncology Multi-Disciplinary Disposition Conference Note  Date of the Conference: 02/02/2019  Patient Name: Rachel Herrera  Referring Provider: Dr. Benjie Karvonen Primary GYN Oncologist: Dr. Denman George  Stage/Disposition:  Stage I vs II grade 1 endometrioid endometrial cancer. Disposition is to surgery followed by further therapy to be determined.   This Multidisciplinary conference took place involving physicians from [place], [place], Radiation Oncology, Pathology, Radiology along with the Gynecologic Oncology Nurse Practitioner and RN.  Comprehensive assessment of the patient's malignancy, staging, need for surgery, chemotherapy, radiation therapy, and need for further testing were reviewed. Supportive measures, both inpatient and following discharge were also discussed. The recommended plan of care is documented. Greater than 35 minutes were spent correlating and coordinating this patient's care.

## 2019-02-11 NOTE — Patient Instructions (Addendum)
DUE TO COVID-19 ONLY ONE VISITOR IS ALLOWED TO COME WITH YOU AND STAY IN THE WAITING ROOM ONLY DURING PRE OP AND PROCEDURE DAY OF SURGERY. THE 1 VISITOR MAY VISIT WITH YOU AFTER SURGERY IN YOUR PRIVATE ROOM DURING VISITING HOURS ONLY!  YOU NEED TO HAVE A COVID 19 TEST ON__1-8-21_____ @_______ , THIS TEST MUST BE DONE BEFORE SURGERY, COME  [place] , [id number].  ([place]) ONCE YOUR COVID TEST IS COMPLETED, PLEASE BEGIN THE QUARANTINE INSTRUCTIONS AS OUTLINED IN YOUR HANDOUT.                Rachel Herrera  02/11/2019   Your procedure is scheduled on: 02-17-19   Report to [facility] Main  Entrance   Report to  Short stay  at       0530 AM     Call this number if you have problems the morning of surgery [phone]    Remember: Eat a light diet the day before surgery.  Examples including soups, broths, toast, yogurt, mashed potatoes.  Things to avoid include carbonated beverages (fizzy beverages), raw fruits and raw vegetables, or beans. After Midnight begin a clear liquid diet until 0400 am then nothing by mouth  If your bowels are filled with gas, your surgeon will have difficulty visualizing your pelvic organs which increases your surgical risks.    BRUSH YOUR TEETH MORNING OF SURGERY AND RINSE YOUR MOUTH OUT, NO CHEWING GUM CANDY OR MINTS.    CLEAR LIQUID DIET   Foods Allowed                                                                     Foods Excluded  Coffee and tea, regular and decaf                             liquids that you cannot  Plain Jell-O any favor except red or purple                                           see through such as: Fruit ices (not with fruit pulp)                                     milk, soups, orange juice  Iced Popsicles                                    All solid food                                    Cranberry, grape and apple juices Sports drinks like Gatorade Lightly seasoned clear  broth or consume(fat free) Sugar, honey syrup  Sample Menu Breakfast  Lunch                                     Supper Cranberry juice                    Beef broth                            Chicken broth Jell-O                                     Grape juice                           Apple juice Coffee or tea                        Jell-O                                      Popsicle                                                Coffee or tea                        Coffee or tea  _____________________________________________________________________      Take these medicines the morning of surgery with A SIP OF WATER: NONE  DO NOT TAKE ANY DIABETIC MEDICATIONS DAY OF YOUR SURGERY                               You may not have any metal on your body including hair pins and              piercings  Do not wear jewelry, make-up, lotions, powders or perfumes, deodorant             Do not wear nail polish on your fingernails.  Do not shave  48 hours prior to surgery.     Do not bring valuables to the hospital. [place].  Contacts, dentures or bridgework may not be worn into surgery.      Patients discharged the day of surgery will not be allowed to drive home. IF YOU ARE HAVING SURGERY AND GOING HOME THE SAME DAY, YOU MUST HAVE AN ADULT TO DRIVE YOU HOME AND BE WITH YOU FOR 24 HOURS. YOU MAY GO HOME BY TAXI OR UBER OR ORTHERWISE, BUT AN ADULT MUST ACCOMPANY YOU HOME AND STAY WITH YOU FOR 24 HOURS.  Name and phone number of your driver:  Special Instructions: N/A              Please read over the following fact sheets you were given: _____________________________________________________________________             [facility] - Preparing for Surgery Before surgery, you can play an important  role.  Because skin is not sterile, your skin needs to be as free of germs as possible.  You can reduce the number  of germs on your skin by washing with CHG (chlorahexidine gluconate) soap before surgery.  CHG is an antiseptic cleaner which kills germs and bonds with the skin to continue killing germs even after washing. Please DO NOT use if you have an allergy to CHG or antibacterial soaps.  If your skin becomes reddened/irritated stop using the CHG and inform your nurse when you arrive at Short Stay. Do not shave (including legs and underarms) for at least 48 hours prior to the first CHG shower.  You may shave your face/neck. Please follow these instructions carefully:  1.  Shower with CHG Soap the night before surgery and the  morning of Surgery.  2.  If you choose to wash your hair, wash your hair first as usual with your  normal  shampoo.  3.  After you shampoo, rinse your hair and body thoroughly to remove the  shampoo.                           4.  Use CHG as you would any other liquid soap.  You can apply chg directly  to the skin and wash                       Gently with a scrungie or clean washcloth.  5.  Apply the CHG Soap to your body ONLY FROM THE NECK DOWN.   Do not use on face/ open                           Wound or open sores. Avoid contact with eyes, ears mouth and genitals (private parts).                       Wash face,  Genitals (private parts) with your normal soap.             6.  Wash thoroughly, paying special attention to the area where your surgery  will be performed.  7.  Thoroughly rinse your body with warm water from the neck down.  8.  DO NOT shower/wash with your normal soap after using and rinsing off  the CHG Soap.                9.  Pat yourself dry with a clean towel.            10.  Wear clean pajamas.            11.  Place clean sheets on your bed the night of your first shower and do not  sleep with pets. Day of Surgery : Do not apply any lotions/deodorants the morning of surgery.  Please wear clean clothes to the hospital/surgery center.  FAILURE TO FOLLOW THESE  INSTRUCTIONS MAY RESULT IN THE CANCELLATION OF YOUR SURGERY PATIENT SIGNATURE_________________________________  NURSE SIGNATURE__________________________________  ________________________________________________________________________   Rachel Herrera  An incentive spirometer is a tool that can help keep your lungs clear and active. This tool measures how well you are filling your lungs with each breath. Taking long deep breaths may help reverse or decrease the chance of developing breathing (pulmonary) problems (especially infection) following:  A long period of time when you are unable to move or be active. BEFORE THE PROCEDURE  If the spirometer includes an indicator to show your best effort, your nurse or respiratory therapist will set it to a desired goal.  If possible, sit up straight or lean slightly forward. Try not to slouch.  Hold the incentive spirometer in an upright position. INSTRUCTIONS FOR USE  1. Sit on the edge of your bed if possible, or sit up as far as you can in bed or on a chair. 2. Hold the incentive spirometer in an upright position. 3. Breathe out normally. 4. Place the mouthpiece in your mouth and seal your lips tightly around it. 5. Breathe in slowly and as deeply as possible, raising the piston or the ball toward the top of the column. 6. Hold your breath for 3-5 seconds or for as long as possible. Allow the piston or ball to fall to the bottom of the column. 7. Remove the mouthpiece from your mouth and breathe out normally. 8. Rest for a few seconds and repeat Steps 1 through 7 at least 10 times every 1-2 hours when you are awake. Take your time and take a few normal breaths between deep breaths. 9. The spirometer may include an indicator to show your best effort. Use the indicator as a goal to work toward during each repetition. 10. After each set of 10 deep breaths, practice coughing to be sure your lungs are clear. If you have an incision (the  cut made at the time of surgery), support your incision when coughing by placing a pillow or rolled up towels firmly against it. Once you are able to get out of bed, walk around indoors and cough well. You may stop using the incentive spirometer when instructed by your caregiver.  RISKS AND COMPLICATIONS  Take your time so you do not get dizzy or light-headed.  If you are in pain, you may need to take or ask for pain medication before doing incentive spirometry. It is harder to take a deep breath if you are having pain. AFTER USE  Rest and breathe slowly and easily.  It can be helpful to keep track of a log of your progress. Your caregiver can provide you with a simple table to help with this. If you are using the spirometer at home, follow these instructions: [place] IF:   You are having difficultly using the spirometer.  You have trouble using the spirometer as often as instructed.  Your pain medication is not giving enough relief while using the spirometer.  You develop fever of 100.5 F (38.1 C) or higher. SEEK IMMEDIATE MEDICAL CARE IF:   You cough up bloody sputum that had not been present before.  You develop fever of 102 F (38.9 C) or greater.  You develop worsening pain at or near the incision site. MAKE SURE YOU:   Understand these instructions.  Will watch your condition.  Will get help right away if you are not doing well or get worse. Document Released: 06/04/2006 Document Revised: 04/16/2011 Document Reviewed: 08/05/2006 Exit[employer] Patient Information 2014 [employer], [place].   ________________________________________________________________________  WHAT IS A BLOOD TRANSFUSION? Blood Transfusion Information  A transfusion is the replacement of blood or some of its parts. Blood is made up of multiple cells which provide different functions.  Red blood cells carry oxygen and are used for blood loss replacement.  White blood cells fight against  infection.  Platelets control bleeding.  Plasma helps clot blood.  Other blood products are available for specialized needs, such as hemophilia or other clotting  disorders. BEFORE THE TRANSFUSION  Who gives blood for transfusions?   Healthy volunteers who are fully evaluated to make sure their blood is safe. This is blood bank blood. Transfusion therapy is the safest it has ever been in the practice of medicine. Before blood is taken from a donor, a complete history is taken to make sure that person has no history of diseases nor engages in risky social behavior (examples are intravenous drug use or sexual activity with multiple partners). The donor's travel history is screened to minimize risk of transmitting infections, such as malaria. The donated blood is tested for signs of infectious diseases, such as HIV and hepatitis. The blood is then tested to be sure it is compatible with you in order to minimize the chance of a transfusion reaction. If you or a relative donates blood, this is often done in anticipation of surgery and is not appropriate for emergency situations. It takes many days to process the donated blood. RISKS AND COMPLICATIONS Although transfusion therapy is very safe and saves many lives, the main dangers of transfusion include:   Getting an infectious disease.  Developing a transfusion reaction. This is an allergic reaction to something in the blood you were given. Every precaution is taken to prevent this. The decision to have a blood transfusion has been considered carefully by your caregiver before blood is given. Blood is not given unless the benefits outweigh the risks. AFTER THE TRANSFUSION  Right after receiving a blood transfusion, you will usually feel much better and more energetic. This is especially true if your red blood cells have gotten low (anemic). The transfusion raises the level of the red blood cells which carry oxygen, and this usually causes an energy  increase.  The nurse administering the transfusion will monitor you carefully for complications. HOME CARE INSTRUCTIONS  No special instructions are needed after a transfusion. You may find your energy is better. Speak with your caregiver about any limitations on activity for underlying diseases you may have. SEEK MEDICAL CARE IF:   Your condition is not improving after your transfusion.  You develop redness or irritation at the intravenous (IV) site. SEEK IMMEDIATE MEDICAL CARE IF:  Any of the following symptoms occur over the next 12 hours:  Shaking chills.  You have a temperature by mouth above 102 F (38.9 C), not controlled by medicine.  Chest, back, or muscle pain.  People around you feel you are not acting correctly or are confused.  Shortness of breath or difficulty breathing.  Dizziness and fainting.  You get a rash or develop hives.  You have a decrease in urine output.  Your urine turns a dark color or changes to pink, red, or brown. Any of the following symptoms occur over the next 10 days:  You have a temperature by mouth above 102 F (38.9 C), not controlled by medicine.  Shortness of breath.  Weakness after normal activity.  The white part of the eye turns yellow (jaundice).  You have a decrease in the amount of urine or are urinating less often.  Your urine turns a dark color or changes to pink, red, or brown. Document Released: 01/20/2000 Document Revised: 04/16/2011 Document Reviewed: 09/08/2007 [employer] [facility] Patient Information 2014 [place], [place].  _______________________________________________________________________

## 2019-02-12 ENCOUNTER — Encounter (HOSPITAL_COMMUNITY): Payer: Self-pay

## 2019-02-12 ENCOUNTER — Encounter (HOSPITAL_COMMUNITY)
Admission: RE | Admit: 2019-02-12 | Discharge: 2019-02-12 | Disposition: A | Discharge status: Home / Self Care | Payer: 59 | Source: Ambulatory Visit | Attending: Gynecologic Oncology | Admitting: Gynecologic Oncology

## 2019-02-12 ENCOUNTER — Other Ambulatory Visit: Payer: Self-pay

## 2019-02-12 DIAGNOSIS — Z01812 Encounter for preprocedural laboratory examination: Secondary | ICD-10-CM | POA: Insufficient documentation

## 2019-02-12 HISTORY — DX: Malignant (primary) neoplasm, unspecified: C80.1

## 2019-02-12 NOTE — Progress Notes (Signed)
PCP - Rachel Herrera  Cardiologist -   Chest x-ray - 12-24-18 epic EKG - 12-12-18 epic Stress Test -  ECHO -  Cardiac Cath -   Sleep Study -  CPAP - [facility] no cpap lost insurance at time of sleep study and has not followed up  Fasting Blood Sugar -  Checks Blood Sugar _____ times a day  Blood Thinner Instructions: Aspirin Instructions: Last Dose:  Anesthesia review: BMI 48.78 ESTIMATED PER PT STATEMENT ON PHONE CALL . [facility] NO CPAP  Patient denies shortness of breath, fever, cough and chest pain at PAT appointment  Denies at preop call    Patient verbalized understanding of instructions that were given to them at the PAT appointment. Patient was also instructed that they will need to review over the PAT instructions again at home before surgery.

## 2019-02-13 ENCOUNTER — Other Ambulatory Visit (HOSPITAL_COMMUNITY)
Admission: RE | Admit: 2019-02-13 | Discharge: 2019-02-13 | Disposition: A | Discharge status: Home / Self Care | Payer: 59 | Source: Ambulatory Visit | Attending: Gynecologic Oncology | Admitting: Gynecologic Oncology

## 2019-02-13 ENCOUNTER — Encounter (HOSPITAL_COMMUNITY)
Admission: RE | Admit: 2019-02-13 | Discharge: 2019-02-13 | Disposition: A | Discharge status: Home / Self Care | Payer: 59 | Source: Ambulatory Visit | Attending: Gynecologic Oncology | Admitting: Gynecologic Oncology

## 2019-02-13 DIAGNOSIS — Z01812 Encounter for preprocedural laboratory examination: Secondary | ICD-10-CM | POA: Insufficient documentation

## 2019-02-13 DIAGNOSIS — Z20822 Contact with and (suspected) exposure to covid-19: Secondary | ICD-10-CM | POA: Insufficient documentation

## 2019-02-13 LAB — COMPREHENSIVE METABOLIC PANEL
ALT: 65 U/L — ABNORMAL HIGH (ref 0–44)
AST: 50 U/L — ABNORMAL HIGH (ref 15–41)
Albumin: 3.9 g/dL (ref 3.5–5.0)
Alkaline Phosphatase: 91 U/L (ref 38–126)
Anion gap: 10 (ref 5–15)
BUN: 12 mg/dL (ref 6–20)
CO2: 28 mmol/L (ref 22–32)
Calcium: 9.9 mg/dL (ref 8.9–10.3)
Chloride: 98 mmol/L (ref 98–111)
Creatinine, Ser: 0.69 mg/dL (ref 0.44–1.00)
GFR calc Af Amer: >60 mL/min (ref >60)
GFR calc non Af Amer: >60 mL/min (ref >60)
Glucose, Bld: 95 mg/dL (ref 70–99)
Potassium: 4.2 mmol/L (ref 3.5–5.1)
Sodium: 136 mmol/L (ref 135–145)
Total Bilirubin: 1.3 mg/dL — ABNORMAL HIGH (ref 0.3–1.2)
Total Protein: 7.6 g/dL (ref 6.5–8.1)

## 2019-02-13 LAB — URINALYSIS, ROUTINE W REFLEX MICROSCOPIC
Bilirubin Urine: NEGATIVE
Glucose, UA: NEGATIVE mg/dL
Hgb urine dipstick: NEGATIVE
Ketones, ur: NEGATIVE mg/dL
Nitrite: NEGATIVE
Protein, ur: NEGATIVE mg/dL
Specific Gravity, Urine: 1.006 (ref 1.005–1.030)
pH: 6 (ref 5.0–8.0)

## 2019-02-13 LAB — CBC
HCT: 44.5 % (ref 36.0–46.0)
Hemoglobin: 13.6 g/dL (ref 12.0–15.0)
MCH: 26.3 pg (ref 26.0–34.0)
MCHC: 30.6 g/dL (ref 30.0–36.0)
MCV: 86.1 fL (ref 80.0–100.0)
Platelets: 211 10*3/uL (ref 150–400)
RBC: 5.17 MIL/uL — ABNORMAL HIGH (ref 3.87–5.11)
RDW: 16.5 % — ABNORMAL HIGH (ref 11.5–15.5)
WBC: 5.6 10*3/uL (ref 4.0–10.5)
nRBC: 0 % (ref 0.0–0.2)

## 2019-02-13 LAB — ABO/RH: ABO/RH(D): A POS

## 2019-02-15 LAB — NOVEL CORONAVIRUS, NAA (HOSP ORDER, SEND-OUT TO REF LAB; TAT 18-24 HRS): SARS-CoV-2, NAA: NOT DETECTED

## 2019-02-16 ENCOUNTER — Telehealth: Payer: Self-pay

## 2019-02-16 MED ORDER — DEXTROSE 5 % IV SOLN
3.0000 g | INTRAVENOUS | Status: AC
  Administered 2019-02-17: 3 g via INTRAVENOUS
  Filled 2019-02-16: qty 3

## 2019-02-16 NOTE — Telephone Encounter (Signed)
Spoke with Rachel Herrera and she stated that she understands her instructions and does not have questions or concerns at this time.

## 2019-02-16 NOTE — Anesthesia Preprocedure Evaluation (Addendum)
Anesthesia Evaluation  Patient identified by MRN, date of birth, ID band Patient awake    Reviewed: Allergy & Precautions, NPO status , Patient's Chart, lab work & pertinent test results  History of Anesthesia Complications Negative for: history of anesthetic complications  Airway Mallampati: IV  TM Distance: >3 FB Neck ROM: Full    Dental  (+) Missing, Poor Dentition, Chipped,    Pulmonary sleep apnea , Patient abstained from smoking., former smoker,    Pulmonary exam normal        Cardiovascular hypertension, Pt. on medications Normal cardiovascular exam     Neuro/Psych Depression negative neurological ROS     GI/Hepatic negative GI ROS, Neg liver ROS,   Endo/Other  Morbid obesity  Renal/GU negative Renal ROS  negative genitourinary   Musculoskeletal negative musculoskeletal ROS (+)   Abdominal   Peds  Hematology negative hematology ROS (+)   Anesthesia Other Findings Day of surgery medications reviewed with patient.  Reproductive/Obstetrics Endometrial ca                           Anesthesia Physical Anesthesia Plan  ASA: III  Anesthesia Plan: General   Post-op Pain Management:    Induction: Intravenous  PONV Risk Score and Plan: 4 or greater and Treatment may vary due to age or medical condition, Midazolam, Dexamethasone, Ondansetron, Scopolamine patch - Pre-op and Propofol infusion  Airway Management Planned: Oral ETT  Additional Equipment: None  Intra-op Plan:   Post-operative Plan: Extubation in OR  Informed Consent: I have reviewed the patients History and Physical, chart, labs and discussed the procedure including the risks, benefits and alternatives for the proposed anesthesia with the patient or authorized representative who has indicated his/her understanding and acceptance.     Dental advisory given  Plan Discussed with: CRNA  Anesthesia Plan Comments:         Anesthesia Quick Evaluation

## 2019-02-17 ENCOUNTER — Ambulatory Visit (HOSPITAL_COMMUNITY): Payer: 59 | Admitting: Physician Assistant

## 2019-02-17 ENCOUNTER — Other Ambulatory Visit: Payer: Self-pay

## 2019-02-17 ENCOUNTER — Encounter (HOSPITAL_COMMUNITY): Payer: Self-pay | Admitting: Gynecologic Oncology

## 2019-02-17 ENCOUNTER — Ambulatory Visit (HOSPITAL_COMMUNITY)
Admission: RE | Admit: 2019-02-17 | Discharge: 2019-02-17 | Disposition: A | Discharge status: Home / Self Care | Payer: 59 | Source: Ambulatory Visit | Attending: Gynecologic Oncology | Admitting: Gynecologic Oncology

## 2019-02-17 ENCOUNTER — Encounter (HOSPITAL_COMMUNITY)
Admission: RE | Disposition: A | Discharge status: Home / Self Care | Payer: Self-pay | Source: Ambulatory Visit | Attending: Gynecologic Oncology

## 2019-02-17 DIAGNOSIS — Z79899 Other long term (current) drug therapy: Secondary | ICD-10-CM | POA: Insufficient documentation

## 2019-02-17 DIAGNOSIS — N83201 Unspecified ovarian cyst, right side: Secondary | ICD-10-CM | POA: Diagnosis not present

## 2019-02-17 DIAGNOSIS — Z87891 Personal history of nicotine dependence: Secondary | ICD-10-CM | POA: Diagnosis not present

## 2019-02-17 DIAGNOSIS — G4733 Obstructive sleep apnea (adult) (pediatric): Secondary | ICD-10-CM | POA: Insufficient documentation

## 2019-02-17 DIAGNOSIS — Z8249 Family history of ischemic heart disease and other diseases of the circulatory system: Secondary | ICD-10-CM | POA: Diagnosis not present

## 2019-02-17 DIAGNOSIS — C541 Malignant neoplasm of endometrium: Secondary | ICD-10-CM | POA: Diagnosis not present

## 2019-02-17 DIAGNOSIS — I10 Essential (primary) hypertension: Secondary | ICD-10-CM | POA: Diagnosis not present

## 2019-02-17 DIAGNOSIS — C7982 Secondary malignant neoplasm of genital organs: Secondary | ICD-10-CM

## 2019-02-17 DIAGNOSIS — Z6841 Body Mass Index (BMI) 40.0 and over, adult: Secondary | ICD-10-CM | POA: Insufficient documentation

## 2019-02-17 DIAGNOSIS — D649 Anemia, unspecified: Secondary | ICD-10-CM | POA: Diagnosis not present

## 2019-02-17 DIAGNOSIS — C55 Malignant neoplasm of uterus, part unspecified: Secondary | ICD-10-CM | POA: Diagnosis present

## 2019-02-17 DIAGNOSIS — N83202 Unspecified ovarian cyst, left side: Secondary | ICD-10-CM | POA: Diagnosis not present

## 2019-02-17 DIAGNOSIS — E66813 Obesity, class 3: Secondary | ICD-10-CM | POA: Diagnosis present

## 2019-02-17 HISTORY — PX: SENTINEL NODE BIOPSY: SHX6608

## 2019-02-17 HISTORY — PX: ROBOTIC ASSISTED TOTAL HYSTERECTOMY: SHX6085

## 2019-02-17 LAB — TYPE AND SCREEN
ABO/RH(D): A POS
Antibody Screen: NEGATIVE

## 2019-02-17 LAB — PREGNANCY, URINE: Preg Test, Ur: NEGATIVE

## 2019-02-17 SURGERY — HYSTERECTOMY, TOTAL, ROBOT-ASSISTED
Anesthesia: General

## 2019-02-17 MED ORDER — DEXAMETHASONE SODIUM PHOSPHATE 4 MG/ML IJ SOLN: 4.0000 mg | INTRAMUSCULAR | Status: DC

## 2019-02-17 MED ORDER — LIDOCAINE 20MG/ML (2%) 15 ML SYRINGE OPTIME
INTRAMUSCULAR | Status: DC | PRN
  Administered 2019-02-17: 1.5 mg/kg/h via INTRAVENOUS

## 2019-02-17 MED ORDER — ONDANSETRON HCL 4 MG/2ML IJ SOLN
INTRAMUSCULAR | Status: AC
  Filled 2019-02-17: qty 2

## 2019-02-17 MED ORDER — SCOPOLAMINE 1 MG/3DAYS TD PT72
1.0000 | MEDICATED_PATCH | TRANSDERMAL | Status: DC
  Administered 2019-02-17: 1.5 mg via TRANSDERMAL
  Filled 2019-02-17: qty 1

## 2019-02-17 MED ORDER — GABAPENTIN 300 MG PO CAPS
300.0000 mg | ORAL_CAPSULE | ORAL | Status: AC
  Administered 2019-02-17: 300 mg via ORAL
  Filled 2019-02-17: qty 1

## 2019-02-17 MED ORDER — ROCURONIUM BROMIDE 100 MG/10ML IV SOLN
INTRAVENOUS | Status: DC | PRN
  Administered 2019-02-17: 30 mg via INTRAVENOUS
  Administered 2019-02-17 (×2): 20 mg via INTRAVENOUS
  Administered 2019-02-17: 50 mg via INTRAVENOUS
  Administered 2019-02-17: 20 mg via INTRAVENOUS

## 2019-02-17 MED ORDER — LACTATED RINGERS IV SOLN: INTRAVENOUS | Status: DC

## 2019-02-17 MED ORDER — KETOROLAC TROMETHAMINE 30 MG/ML IJ SOLN
30.0000 mg | Freq: Once | INTRAMUSCULAR | Status: DC | PRN
  Administered 2019-02-17: 30 mg via INTRAVENOUS

## 2019-02-17 MED ORDER — HYDROMORPHONE HCL 1 MG/ML IJ SOLN: 0.2500 mg | INTRAMUSCULAR | Status: DC | PRN

## 2019-02-17 MED ORDER — KETAMINE HCL 10 MG/ML IJ SOLN
INTRAMUSCULAR | Status: AC
  Filled 2019-02-17: qty 1

## 2019-02-17 MED ORDER — FENTANYL CITRATE (PF) 250 MCG/5ML IJ SOLN
INTRAMUSCULAR | Status: DC | PRN
  Administered 2019-02-17 (×2): 50 ug via INTRAVENOUS
  Administered 2019-02-17: 100 ug via INTRAVENOUS
  Administered 2019-02-17: 50 ug via INTRAVENOUS

## 2019-02-17 MED ORDER — SUGAMMADEX SODIUM 500 MG/5ML IV SOLN
INTRAVENOUS | Status: DC | PRN
  Administered 2019-02-17: 400 mg via INTRAVENOUS

## 2019-02-17 MED ORDER — HYDROMORPHONE HCL 2 MG/ML IJ SOLN
INTRAMUSCULAR | Status: AC
  Filled 2019-02-17: qty 1

## 2019-02-17 MED ORDER — ROCURONIUM BROMIDE 10 MG/ML (PF) SYRINGE
PREFILLED_SYRINGE | INTRAVENOUS | Status: AC
  Filled 2019-02-17: qty 10

## 2019-02-17 MED ORDER — KETOROLAC TROMETHAMINE 30 MG/ML IJ SOLN
INTRAMUSCULAR | Status: AC
  Filled 2019-02-17: qty 1

## 2019-02-17 MED ORDER — FENTANYL CITRATE (PF) 250 MCG/5ML IJ SOLN
INTRAMUSCULAR | Status: AC
  Filled 2019-02-17: qty 5

## 2019-02-17 MED ORDER — KETAMINE HCL 10 MG/ML IJ SOLN
INTRAMUSCULAR | Status: DC | PRN
  Administered 2019-02-17: 20 mg via INTRAVENOUS
  Administered 2019-02-17: 50 mg via INTRAVENOUS

## 2019-02-17 MED ORDER — DEXAMETHASONE SODIUM PHOSPHATE 10 MG/ML IJ SOLN
INTRAMUSCULAR | Status: AC
  Filled 2019-02-17: qty 1

## 2019-02-17 MED ORDER — OXYCODONE HCL 5 MG/5ML PO SOLN: 5.0000 mg | Freq: Once | ORAL | Status: DC | PRN

## 2019-02-17 MED ORDER — OXYCODONE HCL 5 MG PO TABS: 5.0000 mg | ORAL_TABLET | Freq: Once | ORAL | Status: DC | PRN

## 2019-02-17 MED ORDER — SUCCINYLCHOLINE CHLORIDE 200 MG/10ML IV SOSY
PREFILLED_SYRINGE | INTRAVENOUS | Status: AC
  Filled 2019-02-17: qty 10

## 2019-02-17 MED ORDER — ACETAMINOPHEN 500 MG PO TABS
1000.0000 mg | ORAL_TABLET | Freq: Once | ORAL | Status: AC
  Administered 2019-02-17: 1000 mg via ORAL
  Filled 2019-02-17: qty 2

## 2019-02-17 MED ORDER — LIDOCAINE 2% (20 MG/ML) 5 ML SYRINGE
INTRAMUSCULAR | Status: AC
  Filled 2019-02-17: qty 5

## 2019-02-17 MED ORDER — PROPOFOL 10 MG/ML IV BOLUS
INTRAVENOUS | Status: DC | PRN
  Administered 2019-02-17: 200 mg via INTRAVENOUS

## 2019-02-17 MED ORDER — HYDROMORPHONE HCL 1 MG/ML IJ SOLN
INTRAMUSCULAR | Status: DC | PRN
  Administered 2019-02-17 (×2): 1 mg via INTRAVENOUS

## 2019-02-17 MED ORDER — STERILE WATER FOR INJECTION IJ SOLN
INTRAMUSCULAR | Status: DC | PRN
  Administered 2019-02-17: 9 mL

## 2019-02-17 MED ORDER — BUPIVACAINE HCL (PF) 0.25 % IJ SOLN
INTRAMUSCULAR | Status: AC
  Filled 2019-02-17: qty 30

## 2019-02-17 MED ORDER — MIDAZOLAM HCL 2 MG/2ML IJ SOLN
INTRAMUSCULAR | Status: AC
  Filled 2019-02-17: qty 2

## 2019-02-17 MED ORDER — LIDOCAINE HCL 2 % IJ SOLN
INTRAMUSCULAR | Status: AC
  Filled 2019-02-17: qty 20

## 2019-02-17 MED ORDER — CELECOXIB 200 MG PO CAPS
400.0000 mg | ORAL_CAPSULE | ORAL | Status: AC
  Administered 2019-02-17: 400 mg via ORAL
  Filled 2019-02-17: qty 2

## 2019-02-17 MED ORDER — SUGAMMADEX SODIUM 500 MG/5ML IV SOLN
INTRAVENOUS | Status: AC
  Filled 2019-02-17: qty 5

## 2019-02-17 MED ORDER — STERILE WATER FOR IRRIGATION IR SOLN
Status: DC | PRN
  Administered 2019-02-17: 1000 mL

## 2019-02-17 MED ORDER — ACETAMINOPHEN 500 MG PO TABS: 1000.0000 mg | ORAL_TABLET | ORAL | Status: DC

## 2019-02-17 MED ORDER — SUCCINYLCHOLINE CHLORIDE 20 MG/ML IJ SOLN
INTRAMUSCULAR | Status: DC | PRN
  Administered 2019-02-17: 160 mg via INTRAVENOUS

## 2019-02-17 MED ORDER — LIDOCAINE HCL (CARDIAC) PF 100 MG/5ML IV SOSY
PREFILLED_SYRINGE | INTRAVENOUS | Status: DC | PRN
  Administered 2019-02-17: 100 mg via INTRAVENOUS

## 2019-02-17 MED ORDER — INDOCYANINE GREEN 25 MG IV SOLR
INTRAVENOUS | Status: DC | PRN
  Administered 2019-02-17: 2.5 mg

## 2019-02-17 MED ORDER — SODIUM CHLORIDE 0.9% FLUSH: 3.0000 mL | Freq: Two times a day (BID) | INTRAVENOUS | Status: DC

## 2019-02-17 MED ORDER — ENOXAPARIN SODIUM 40 MG/0.4ML ~~LOC~~ SOLN
40.0000 mg | SUBCUTANEOUS | Status: AC
  Administered 2019-02-17: 06:00:00 40 mg via SUBCUTANEOUS
  Filled 2019-02-17: qty 0.4

## 2019-02-17 MED ORDER — PROMETHAZINE HCL 25 MG/ML IJ SOLN: 6.2500 mg | INTRAMUSCULAR | Status: DC | PRN

## 2019-02-17 MED ORDER — STERILE WATER FOR INJECTION IJ SOLN
INTRAMUSCULAR | Status: AC
  Filled 2019-02-17: qty 10

## 2019-02-17 MED ORDER — KETOROLAC TROMETHAMINE 30 MG/ML IJ SOLN: 30.0000 mg | Freq: Once | INTRAMUSCULAR | Status: DC | PRN

## 2019-02-17 MED ORDER — PROPOFOL 10 MG/ML IV BOLUS
INTRAVENOUS | Status: AC
  Filled 2019-02-17: qty 20

## 2019-02-17 MED ORDER — MIDAZOLAM HCL 5 MG/5ML IJ SOLN
INTRAMUSCULAR | Status: DC | PRN
  Administered 2019-02-17: 2 mg via INTRAVENOUS

## 2019-02-17 MED ORDER — BUPIVACAINE HCL (PF) 0.25 % IJ SOLN
INTRAMUSCULAR | Status: DC | PRN
  Administered 2019-02-17: 20 mL

## 2019-02-17 MED ORDER — LACTATED RINGERS IR SOLN
Status: DC | PRN
  Administered 2019-02-17: 1000 mL

## 2019-02-17 MED ORDER — ONDANSETRON HCL 4 MG/2ML IJ SOLN
INTRAMUSCULAR | Status: DC | PRN
  Administered 2019-02-17: 4 mg via INTRAVENOUS

## 2019-02-17 SURGICAL SUPPLY — 71 items
APPLICATOR SURGIFLO ENDO (HEMOSTASIS) IMPLANT
BACTOSHIELD CHG 4% 4OZ (MISCELLANEOUS) ×1
BAG LAPAROSCOPIC 12 15 PORT 16 (BASKET) IMPLANT
BAG RETRIEVAL 12/15 (BASKET)
BLADE EXTENDED COATED 6.5IN (ELECTRODE) ×3 IMPLANT
BLADE SURG SZ10 CARB STEEL (BLADE) IMPLANT
COVER BACK TABLE 60X90IN (DRAPES) ×3 IMPLANT
COVER TIP SHEARS 8 DVNC (MISCELLANEOUS) ×2 IMPLANT
COVER TIP SHEARS 8MM DA VINCI (MISCELLANEOUS) ×1
COVER WAND RF STERILE (DRAPES) IMPLANT
DECANTER SPIKE VIAL GLASS SM (MISCELLANEOUS) ×3 IMPLANT
DERMABOND ADVANCED (GAUZE/BANDAGES/DRESSINGS) ×1
DERMABOND ADVANCED .7 DNX12 (GAUZE/BANDAGES/DRESSINGS) ×2 IMPLANT
DRAPE ARM DVNC X/XI (DISPOSABLE) ×8 IMPLANT
DRAPE COLUMN DVNC XI (DISPOSABLE) ×2 IMPLANT
DRAPE DA VINCI XI ARM (DISPOSABLE) ×4
DRAPE DA VINCI XI COLUMN (DISPOSABLE) ×1
DRAPE SHEET LG 3/4 BI-LAMINATE (DRAPES) ×3 IMPLANT
DRAPE SURG IRRIG POUCH 19X23 (DRAPES) ×3 IMPLANT
DRSG OPSITE POSTOP 4X6 (GAUZE/BANDAGES/DRESSINGS) IMPLANT
DRSG OPSITE POSTOP 4X8 (GAUZE/BANDAGES/DRESSINGS) IMPLANT
ELECT PENCIL ROCKER SW 15FT (MISCELLANEOUS) ×3 IMPLANT
ELECT REM PT RETURN 15FT ADLT (MISCELLANEOUS) ×3 IMPLANT
GAUZE 4X4 16PLY RFD (DISPOSABLE) ×3 IMPLANT
GLOVE BIO SURGEON STRL SZ 6 (GLOVE) ×12 IMPLANT
GLOVE BIO SURGEON STRL SZ 6.5 (GLOVE) ×6 IMPLANT
GOWN STRL REUS W/ TWL LRG LVL3 (GOWN DISPOSABLE) ×8 IMPLANT
GOWN STRL REUS W/TWL LRG LVL3 (GOWN DISPOSABLE) ×4
HOLDER FOLEY CATH W/STRAP (MISCELLANEOUS) ×3 IMPLANT
IRRIG SUCT STRYKERFLOW 2 WTIP (MISCELLANEOUS) ×3
IRRIGATION SUCT STRKRFLW 2 WTP (MISCELLANEOUS) ×2 IMPLANT
KIT PROCEDURE DA VINCI SI (MISCELLANEOUS) ×1
KIT PROCEDURE DVNC SI (MISCELLANEOUS) ×2 IMPLANT
KIT TURNOVER KIT A (KITS) IMPLANT
MANIPULATOR UTERINE 4.5 ZUMI (MISCELLANEOUS) ×3 IMPLANT
NEEDLE HYPO 22GX1.5 SAFETY (NEEDLE) ×3 IMPLANT
NEEDLE SPNL 18GX3.5 QUINCKE PK (NEEDLE) ×3 IMPLANT
OBTURATOR OPTICAL STANDARD 8MM (TROCAR) ×1
OBTURATOR OPTICAL STND 8 DVNC (TROCAR) ×2
OBTURATOR OPTICALSTD 8 DVNC (TROCAR) ×2 IMPLANT
PACK ROBOT GYN CUSTOM WL (TRAY / TRAY PROCEDURE) ×3 IMPLANT
PAD POSITIONING PINK XL (MISCELLANEOUS) ×3 IMPLANT
PENCIL SMOKE EVACUATOR (MISCELLANEOUS) IMPLANT
PORT ACCESS TROCAR AIRSEAL 12 (TROCAR) ×2 IMPLANT
PORT ACCESS TROCAR AIRSEAL 5M (TROCAR) ×1
POUCH SPECIMEN RETRIEVAL 10MM (ENDOMECHANICALS) ×6 IMPLANT
SCRUB CHG 4% DYNA-HEX 4OZ (MISCELLANEOUS) ×2 IMPLANT
SEAL CANN UNIV 5-8 DVNC XI (MISCELLANEOUS) ×6 IMPLANT
SEAL XI 5MM-8MM UNIVERSAL (MISCELLANEOUS) ×3
SET TRI-LUMEN FLTR TB AIRSEAL (TUBING) ×3 IMPLANT
SPONGE LAP 18X18 RF (DISPOSABLE) ×3 IMPLANT
SPONGE LAP 18X18 X RAY DECT (DISPOSABLE) IMPLANT
SURGIFLO W/THROMBIN 8M KIT (HEMOSTASIS) IMPLANT
SUT MNCRL AB 4-0 PS2 18 (SUTURE) IMPLANT
SUT PDS AB 1 TP1 96 (SUTURE) IMPLANT
SUT VIC AB 0 CT1 27 (SUTURE) ×4
SUT VIC AB 0 CT1 27XBRD ANTBC (SUTURE) ×8 IMPLANT
SUT VIC AB 2-0 CT1 27 (SUTURE)
SUT VIC AB 2-0 CT1 TAPERPNT 27 (SUTURE) IMPLANT
SUT VIC AB 3-0 SH 27 (SUTURE) ×1
SUT VIC AB 3-0 SH 27XBRD (SUTURE) ×2 IMPLANT
SUT VICRYL 0 UR6 27IN ABS (SUTURE) ×3 IMPLANT
SUT VICRYL 4-0 PS2 18IN ABS (SUTURE) ×6 IMPLANT
SYR 10ML LL (SYRINGE) ×3 IMPLANT
TOWEL OR NON WOVEN STRL DISP B (DISPOSABLE) ×3 IMPLANT
TRAP SPECIMEN MUCOUS 40CC (MISCELLANEOUS) IMPLANT
TRAY FOLEY MTR SLVR 16FR STAT (SET/KITS/TRAYS/PACK) ×3 IMPLANT
TROCAR XCEL NON-BLD 5MMX100MML (ENDOMECHANICALS) IMPLANT
UNDERPAD 30X36 HEAVY ABSORB (UNDERPADS AND DIAPERS) ×3 IMPLANT
WATER STERILE IRR 1000ML POUR (IV SOLUTION) ×3 IMPLANT
YANKAUER SUCT BULB TIP 10FT TU (MISCELLANEOUS) ×3 IMPLANT

## 2019-02-17 NOTE — Anesthesia Procedure Notes (Signed)
Procedure Name: Intubation Date/Time: 02/17/2019 7:39 AM Performed by: Glory Buff, CRNA Pre-anesthesia Checklist: Patient identified, Emergency Drugs available, Suction available and Patient being monitored Patient Re-evaluated:Patient Re-evaluated prior to induction Oxygen Delivery Method: Circle system utilized Preoxygenation: Pre-oxygenation with 100% oxygen Induction Type: IV induction Ventilation: Mask ventilation without difficulty Laryngoscope Size: Miller and 3 Grade View: Grade I Tube type: Oral Tube size: 7.5 mm Number of attempts: 1 Airway Equipment and Method: Stylet and Oral airway Placement Confirmation: ETT inserted through vocal cords under direct vision,  positive ETCO2 and breath sounds checked- equal and bilateral Secured at: 22 cm Tube secured with: Tape Dental Injury: Teeth and Oropharynx as per pre-operative assessment

## 2019-02-17 NOTE — Op Note (Signed)
OPERATIVE NOTE 02/16/18  Surgeon: Donaciano Eva   Assistants: Dr Lahoma Crocker (an MD assistant was necessary for tissue manipulation, management of robotic instrumentation, retraction and positioning due to the complexity of the case and hospital policies).   Anesthesia: General endotracheal anesthesia  ASA Class: 3   Pre-operative Diagnosis: endometrial cancer grade 1, clinical stage 2 vs 3C  Post-operative Diagnosis: same,   Operation: Robotic-assisted laparoscopic modified radical hysterectomy with bilateral salpingoophorectomy, SLN biopsy   Surgeon: Donaciano Eva  Assistant Surgeon: Lahoma Crocker MD  Anesthesia: GET  Urine Output: 100cc (dark amber)  Operative Findings:  No ectocervical disease visible or palpable. No gross extrauterine disease, however, there were bilateral pelvic suspicious lymph nodes (enlarged) all removed.   Estimated Blood Loss:  less than 50 mL      Total IV Fluids: 800 ml         Specimens: uterus, cervix, bilateral tubes and ovaries, right external iliac SLN, right common iliac SLN, left common iliac SLN, left external iliac lymph node.          Complications:  None; patient tolerated the procedure well.         Disposition: PACU - hemodynamically stable.  Procedure Details  The patient was seen in the Holding Room. The risks, benefits, complications, treatment options, and expected outcomes were discussed with the patient.  The patient concurred with the proposed plan, giving informed consent.  The site of surgery properly noted/marked. The patient was identified as Rachel Herrera and the procedure verified as a Robotic-assisted hysterectomy with bilateral salpingo oophorectomy with SLN biopsy. A Time Out was held and the above information confirmed.  After induction of anesthesia, the patient was draped and prepped in the usual sterile manner. Pt was placed in supine position after anesthesia and draped and prepped  in the usual sterile manner. The abdominal drape was placed after the CholoraPrep had been allowed to dry for 3 minutes.  Her arms were tucked to her side with all appropriate precautions.  The shoulders were stabilized with padded shoulder blocks applied to the acromium processes.  The patient was placed in the semi-lithotomy position in [place].  The perineum was prepped with Betadine. The patient was then prepped. Foley catheter was placed.  A sterile speculum was placed in the vagina.  The cervix was grasped with a single-tooth tenaculum. 2mg  total of ICG was injected into the cervical stroma at 2 and 9 o'clock with 1cc injected at a 1cm and 69mm depth (concentration 0.5mg /ml) in all locations. The cervix was dilated with Kennon Rounds dilators.  The ZUMI uterine manipulator with a medium colpotomizer ring was placed without difficulty.  A pneum occluder balloon was placed over the manipulator.  OG tube placement was confirmed and to suction.   Next, a 5 mm skin incision was made 1 cm below the subcostal margin in the midclavicular line.  The 5 mm Optiview port and scope was used for direct entry.  Opening pressure was under 10 mm CO2.  The abdomen was insufflated and the findings were noted as above.   At this point and all points during the procedure, the patient's intra-abdominal pressure did not exceed 15 mmHg. Next, a 10 mm skin incision was made in the umbilicus and a right and left port was placed about 10 cm lateral to the robot port on the right and left side.  A fourth arm was placed in the left lower quadrant 2 cm above and superior and medial to  the anterior superior iliac spine.  All ports were placed under direct visualization.  The patient was placed in steep Trendelenburg.  Bowel was folded away into the upper abdomen.  The robot was docked in the normal manner.  The right and left peritoneum were opened parallel to the IP ligament to open the retroperitoneal spaces bilaterally. The SLN mapping  was performed in bilateral pelvic basins. The para rectal and paravesical spaces were opened up entirely with careful dissection below the level of the ureters bilaterally and to the depth of the uterine artery origin in order to skeletonize the uterine "web" and ensure visualization of all parametrial channels. The para-aortic basins were carefully exposed and evaluated for isolated para-aortic SLN's. Lymphatic channels were identified travelling to the following visualized sentinel lymph node's: right external iliac and common iliac and left common iliac. We also removed the left external iliac enlarged node that corresponded to the MRI finding of an enlarged node though this had not taken up dye. These SLN's were separated from their surrounding lymphatic tissue, removed and sent for permanent pathology.  The hysterectomy was started after the round ligament on the right side was incised and the retroperitoneum was entered and the pararectal space was developed.  The ureter was noted to be on the medial leaf of the broad ligament.  The peritoneum above the ureter was incised and stretched and the infundibulopelvic ligament was skeletonized, cauterized and cut.  The posterior peritoneum was taken down to the level of the KOH ring.  The anterior peritoneum was also taken down.  The bladder flap was created to the level of the KOH ring.  The uterine artery on the right side was skeletonized, cauterized and cut in the normal manner.    Due to the left-sided nature of her cervical involvement, a modified radical hysterectomy was performed on the left.  The paravesical and pararectal spaces were deeply opened until the deep vaginal vein was identified.  The uterine artery was skeletonized at its origin and then bipolar sealed.  The ureter was mobilized from its attachments to the broad ligament and mobilized laterally.  Use of meticulous sharp and monopolar dissection freed the ureter from underneath were times  tunnel until it passed into the bladder.  This enabled [place] to mobilize the uterine artery and parametria over the ureter.  Moderate parametrial dissection was performed on the left at the level of where the uterine artery across the ureter and taking the first third of the uterosacral ligament attempting to preserve hypogastric nerves during the uterosacral dissection.  The posterior dissection continued to the level of the cervicovaginal junction which was dissected to open up the rectovaginal septum and space.  This enabled taking a wider and deeper vaginal margin on the left.  The left ovarian vessels were then skeletonized bipolar sealed and transected  The colpotomy was created circumferentially with care to ensure the parametrium was included with the specimen on the left and a larger left vaginal margin.   Pedicles were inspected and excellent hemostasis was achieved.    The colpotomy at the vaginal cuff was closed with Vicryl on a CT1 needle in a running manner.  Irrigation was used and excellent hemostasis was achieved.  At this point in the procedure was completed.  Robotic instruments were removed under direct visulaization.  The robot was undocked. The 10 mm ports were closed with Vicryl on a UR-5 needle and the fascia was closed with 0 Vicryl on a UR-5 needle.  The  skin was closed with 4-0 Vicryl in a subcuticular manner.  Dermabond was applied.  Sponge, lap and needle counts correct x 2.   The vagina was swabbed with brisk bleeding noted from the left vaginal sidewall. This was made hemostatic with running 0-vicryl.   All instrument and needle counts were correct x  3.   The patient was transferred to the recovery room in a stable condition.  Donaciano Eva, MD

## 2019-02-17 NOTE — Discharge Instructions (Signed)
Return to work: 4 weeks (2 weeks with physical restrictions).  Activity: 1. Be up and out of the bed during the day.  Take a nap if needed.  You may walk up steps but be careful and use the hand rail.  Stair climbing will tire you more than you think, you may need to stop part way and rest.   2. No lifting or straining for 4 weeks.  3. No driving for 1 weeks.  Do Not drive if you are taking narcotic pain medicine.  4. Shower daily.  Use soap and water on your incision and pat dry; don't rub.   5. No sexual activity and nothing in the vagina for 8 weeks.  Medications:  - Take ibuprofen and tylenol first line for pain control. Take these regularly (every 6 hours) to decrease the build up of pain.  - If necessary, for severe pain not relieved by ibuprofen, contact Dr Rossi's office and you will be prescribed percocet.  - While taking percocet you should take sennakot every night to reduce the likelihood of constipation. If this causes diarrhea, stop its use.  Diet: 1. Low sodium Heart Healthy Diet is recommended.  2. It is safe to use a laxative if you have difficulty moving your bowels.   Wound Care: 1. Keep clean and dry.  Shower daily.  Reasons to call the Doctor:   Fever - Oral temperature greater than 100.4 degrees Fahrenheit  Foul-smelling vaginal discharge  Difficulty urinating  Nausea and vomiting  Increased pain at the site of the incision that is unrelieved with pain medicine.  Difficulty breathing with or without chest pain  New calf pain especially if only on one side  Sudden, continuing increased vaginal bleeding with or without clots.   Follow-up: 1. See Emma Rossi in 4 weeks.  Contacts: For questions or concerns you should contact:  Dr. Emma Rossi at [phone] After hours and on week-ends call [phone] and ask to speak to the physician on call for Gynecologic Oncology  After Your Surgery  The information in this section will tell you what  to expect after your surgery, both during your stay and after you leave. You will learn how to safely recover from your surgery. Write down any questions you have and be sure to ask your doctor or nurse.  What to Expect When you wake up after your surgery, you will be in the Post-Anesthesia Care Unit (PACU) or your recovery room. A nurse will be monitoring your body temperature, blood pressure, pulse, and oxygen levels. You may have a urinary catheter in your bladder to help monitor the amount of urine you are making. It should come out before you go home. You will also have compression boots on your lower legs to help your circulation. Your pain medication will be given through an IV line or in tablet form. If you are having pain, tell your nurse. Your nurse will tell you how to recover from your surgery. Below are examples of ways you can help yourself recover safely. . You will be encouraged to walk with the help of your nurse or physical therapist. We will give you medication to relieve pain. Walking helps reduce the risk for blood clots and pneumonia. It also helps to stimulate your bowels so they begin working again. . Use your incentive spirometer. This will help your lungs expand, which prevents pneumonia.   Commonly Asked Questions  Will I have pain after surgery? Yes, you will have some   pain after your surgery, especially in the first few days. Your doctor and nurse will ask you about your pain often. You will be given medication to manage your pain as needed. If your pain is not relieved, please tell your doctor or nurse. It is important to control your pain so you can cough, breathe deeply, use your incentive spirometer, and get out of bed and walk.  Will I be able to eat? Yes, you will be able to eat a regular diet or eat as tolerated. You should start with foods that are soft and easy to digest such as apple sauce and chicken noodle soup. Eat small meals frequently, and then advance  to regular foods. If you experience bloating, gas, or cramps, limit high-fiber foods, including whole grain breads and cereal, nuts, seeds, salads, fresh fruit, broccoli, cabbage, and cauliflower. Will I have pain when I am home? The length of time each person has pain or discomfort varies. You may still have some pain when you go home and will probably be taking pain medication. Follow the guidelines below. . Take your medications as directed and as needed. . Call your doctor if the medication prescribed for you doesn't relieve your pain. . Don't drive or drink alcohol while you're taking prescription pain medication. . As your incision heals, you will have less pain and need less pain medication. A mild pain reliever such as acetaminophen (Tylenol) or ibuprofen (Advil) will relieve aches and discomfort. However, large quantities of acetaminophen may be harmful to your liver. Don't take more acetaminophen than the amount directed on the bottle or as instructed by your doctor or nurse. . Pain medication should help you as you resume your normal activities. Take enough medication to do your exercises comfortably. Pain medication is most effective 30 to 45 minutes after taking it. . Keep track of when you take your pain medication. Taking it when your pain first begins is more effective than waiting for the pain to get worse. Pain medication may cause constipation (having fewer bowel movements than what is normal for you).  How can I prevent constipation? . Go to the bathroom at the same time every day. Your body will get used to going at that time. . If you feel the urge to go, don't put it off. Try to use the bathroom 5 to 15 minutes after meals. . After breakfast is a good time to move your bowels. The reflexes in your colon are strongest at this time. . Exercise, if you can. Walking is an excellent form of exercise. . Drink 8 (8-ounce) glasses (2 liters) of liquids daily, if you can. Drink  water, juices, soups, ice cream shakes, and other drinks that don't have caffeine. Drinks with caffeine, such as coffee and soda, pull fluid out of the body. . Slowly increase the fiber in your diet to 25 to 35 grams per day. Fruits, vegetables, whole grains, and cereals contain fiber. If you have an ostomy or have had recent bowel surgery, check with your doctor or nurse before making any changes in your diet. . Both over-the-counter and prescription medications are available to treat constipation. Start with 1 of the following over-the-counter medications first: o Docusate sodium (Colace) 100 mg. Take ___1__ capsules _2____ times a day. This is a stool softener that causes few side effects. Don't take it with mineral oil. o Polyethylene glycol (MiraLAX) 17 grams daily. o Senna (Senokot) 2 tablets at bedtime. This is a stimulant laxative, which can cause cramping. .   If you haven't had a bowel movement in 2 days, call your doctor or nurse.  Can I shower? Yes, you should shower 24 hours after your surgery. Be sure to shower every day. Taking a warm shower is relaxing and can help decrease muscle aches. Use soap when you shower and gently wash your incision. Pat the areas dry with a towel after showering, and leave your incision uncovered (unless there is drainage). Call your doctor if you see any redness or drainage from your incision. Don't take tub baths until you discuss it with your doctor at the first appointment after your surgery. How do I care for my incisions? You will have several small incisions on your abdomen. The incisions are closed with Steri-Strips or Dermabond. You may also have square white dressings on your incisions ([place]). You can remove these in the shower 24 hours after your surgery. You should clean your incisions with soap and water. If you go home with Steri-Strips on your incision, they will loosen and may fall off by themselves. If they haven't fallen off within 10  days, you can remove them. If you go home with Dermabond over your sutures (stitches), it will also loosen and peel off.  What are the most common symptoms after a hysterectomy? It's common for you to have some vaginal spotting or light bleeding. You should monitor this with a pad or a panty liner. If you have having heavy bleeding (bleeding through a pad or liner every 1 to 2 hours), call your doctor right away. It's also common to have some discomfort after surgery from the air that was pumped into your abdomen during surgery. To help with this, walk, drink plenty of liquids and make sure to take the stool softeners you received.  When is it safe for me to drive? You may resume driving 2 weeks after surgery, as long as you are not taking pain medication that may make you drowsy.  When can I resume sexual activity? Do not place anything in your vagina or have vaginal intercourse for 8 weeks after your surgery. Some people will need to wait longer than 8 weeks, so speak with your doctor before resuming sexual intercourse.  Will I be able to travel? Yes, you can travel. If you are traveling by plane within a few weeks after your surgery, make sure you get up and walk every hour. Be sure to stretch your legs, drink plenty of liquids, and keep your feet elevated when possible.  Will I need any supplies? Most people do not need any supplies after the surgery. In the rare case that you do need supplies, such as tubes or drains, your nurse will order them for you.  When can I return to work? The time it takes to return to work depends on the type of work you do, the type of surgery you had, and how fast your body heals. Most people can return to work about 2 to 4 weeks after the surgery.  What exercises can I do? Exercise will help you gain strength and feel better. Walking and stair climbing are excellent forms of exercise. Gradually increase the distance you walk. Climb stairs slowly, resting or  stopping as needed. Ask your doctor or nurse before starting more strenuous exercises.  When can I lift heavy objects? Most people should not lift anything heavier than 10 pounds (4.5 kilograms) for at least 4 weeks after surgery. Speak with your doctor about when you can do heavy lifting.    How can I cope with my feelings? After surgery for a serious illness, you may have new and upsetting feelings. Many people say they felt weepy, sad, worried, nervous, irritable, and angry at one time or another. You may find that you can't control some of these feelings. If this happens, it's a good idea to seek emotional support. The first step in coping is to talk about how you feel. Family and friends can help. Your nurse, doctor, and social worker can reassure, support, and guide you. It's always a good idea to let these professionals know how you, your family, and your friends are feeling emotionally. Many resources are available to patients and their families. Whether you're in the hospital or at home, the nurses, doctors, and social workers are here to help you and your family and friends handle the emotional aspects of your illness.  When is my first appointment after surgery? Your first appointment after surgery will be 2 to 4 weeks after surgery. Your nurse will give you instructions on how to make this appointment, including the phone number to call.  What if I have other questions? If you have any questions or concerns, please talk with your doctor or nurse. You can reach them Monday through Friday from 9:00 am to 5:00 pm. After 5:00 pm, during the weekend, and on holidays, call [phone] and ask for the doctor on call for your doctor.  . Have a temperature of 101 F (38.3 C) or higher . Have pain that does not get better with pain medication . Have redness, drainage, or swelling from your incisions

## 2019-02-17 NOTE — H&P (Signed)
H&P Note: Gyn-Onc  Consult was requested by Dr. Benjie Karvonen for the evaluation of Rachel Herrera 34 y.o. female  CC:  Stage II (vs stage III) endometrial cancer  Assessment/Plan:  Rachel. AHNESTY Herrera  is a 34 y.o.  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  Her deeply invasive cervical stromal tumor may necessitate a radical hysterectomy (or radical dissection on the left) procedure including a unilateral radical dissection if necessary for adequate tumor clearance.  Given the advanced stage of her cancer, and that she will almost certainly require radiation, I do not think that ovarian preservation is possible or appropriate.  I am recommending robotic assisted modified radical hysterectomy with Bilateral salpingectomy, (possible oophorectomy) and sentinel lymph node biopsy.  I explained that if mapping is not successful complete lymphadenectomy will be performed which carries with it the risk of lymphedema.  I explained that if there is significant cervical stromal involvement or extrauterine disease identified, she would be a candidate for adjuvant treatment such as radiation and/or chemotherapy postoperatively.  If ovaries are retained, pelvic radiation and possibly chemotherapy could be sterilizing with respect to ovarian function.  She understood that hysterectomy would result in permanent infertility.  I explained that if radical hysterectomy is necessary this can result in permanent bladder dysfunction.  She would require a Foley postoperatively if this was necessary.        HPI: Rachel Herrera is a 34 year old P0 who was seen in consultation at the request of Dr Benjie Karvonen for evaluation of FIGO grade 1 endometrial cancer.   The patient reported a longstanding history of menorrhagia with irregular heavy periods.  This culminated in admission to [facility]) where she was  diagnosed with severe anemia, required ICU admission and transfusion of 9 units of PRBC for what she thought was a Hb of 2.9.  She had not seen a gynecologist for approximately 10 years, but after this discharge was seen by Dr Benjie Karvonen in October, 2020. At that time a pap was performed on 11/23/18 which revealed atypical glandular cells.  An ultrasound was performed on 12/02/18 which showed a 5.5 x 3.9x 3.3cm fibroid along the posterior cervical edge extending into the endocervical canal. The fundal endometrium was also thickened. The ovaries were grossly normal.   She was taken to the OR on 12/16/18 for vaginal myomectomy.  Intraoperative findings were significant for a myoma delivering through the cervix that was debulked to less than half its size but with a wide stalk could not facilitate complete excision.  She had minimal bleeding postoperatively.  Final pathology returned as a FIGO grade 1 endometrioid adenocarcinoma.  CT scan of the abdomen and pelvis was performed on December 24, 2018 which revealed mildly enlarged left iliac chain lymph node measuring 16 mm.  There was a mildly enlarged portacaval lymph node measuring 17 mm.  They were felt to be likely reactive.  The uterus was anteverted and grossly unremarkable.  The ovaries were also grossly unremarkable.  The patient's medical history is not remarkable as she has had limited medical care.  She has morbid obesity with a BMI of 50 kg meters squared.  She denies prior testing for diabetes mellitus.  She has had some mild hypertension.  She is never been pregnant.  She is ambivalent about childbearing.  She is married to a man from [place].  She has no family history significant for gynecologic  cancers.  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  MRI On 01/25/19 revealed enlarged bilateral pelvic lymphadenopathy and cervical stromal invasion of the endometrial tumor (especially on left) with no apparent parametrial involvement.    Current Meds:  Outpatient Encounter Medications as of 12/30/2018  Medication Sig  . Melatonin 5 MG CAPS Take 5 mg by mouth at bedtime as needed.  . AMBULATORY NON FORMULARY MEDICATION Supply ordered: CPAP and other supplies needed (headgear, cushions, filters, heated tuubing and water chamber) Dx: obstructive sleep apnea Settings: auto-titration 5-20 cmH2O  . Cholecalciferol (VITAMIN D) 125 MCG (5000 UT) CAPS Take 5,000 Units by mouth every other day.  . ferrous sulfate (FERROUSUL) 325 (65 FE) MG tablet Take 1 tablet (325 mg total) by mouth daily with breakfast.  . hydrochlorothiazide (HYDRODIURIL) 25 MG tablet Take 1 tablet (25 mg total) by mouth daily.  Marland Kitchen ibuprofen (ADVIL) 800 MG tablet Take 1 tablet (800 mg total) by mouth every 8 (eight) hours as needed for moderate pain. For AFTER surgery  . oxyCODONE (OXY IR/ROXICODONE) 5 MG immediate release tablet Take 1 tablet (5 mg total) by mouth every 4 (four) hours as needed for severe pain. For AFTER surgery only, do not take and drive  . senna-docusate (SENOKOT-S) 8.6-50 MG tablet Take 2 tablets by mouth at bedtime. For AFTER surgery, do not take if having diarrhea  . [DISCONTINUED] ibuprofen (ADVIL) 200 MG tablet Take 400 mg by mouth every 6 (six) hours as needed for moderate pain.    No facility-administered encounter medications on file as of 12/30/2018.     Allergy: No Known Allergies  Social Hx:   Social History   Socioeconomic History  . Marital status: Married    Spouse name: Not on file  . Number of children: Not on file  . Years of education: Not on file  . Highest education level: Not on file  Occupational History  . Not on file  Tobacco Use  . Smoking status: Former Smoker    Quit date: 12/12/2015    Years since quitting: 3.1  . Smokeless tobacco: Never Used  Substance and Sexual Activity  . Alcohol use: Yes    Comment: weekends  . Drug use: No  . Sexual activity: Yes    Birth control/protection: Condom  Other  Topics Concern  . Not on file  Social History Narrative  . Not on file   Social Determinants of Health   Financial Resource Strain:   . Difficulty of Paying Living Expenses: Not on file  Food Insecurity:   . Worried About Charity fundraiser in the Last Year: Not on file  . Ran Out of Food in the Last Year: Not on file  Transportation Needs:   . Lack of Transportation (Medical): Not on file  . Lack of Transportation (Non-Medical): Not on file  Physical Activity:   . Days of Exercise per Week: Not on file  . Minutes of Exercise per Session: Not on file  Stress:   . Feeling of Stress : Not on file  Social Connections:   . Frequency of Communication with Friends and Family: Not on file  . Frequency of Social Gatherings with Friends and Family: Not on file  . Attends Religious Services: Not on file  . Active Member of Clubs or Organizations: Not on file  . Attends Archivist Meetings: Not on file  . Marital Status: Not on file  Intimate Partner Violence:   . Fear of Current  or Ex-Partner: Not on file  . Emotionally Abused: Not on file  . Physically Abused: Not on file  . Sexually Abused: Not on file    Past Surgical Hx:  Past Surgical History:  Procedure Laterality Date  . DILATATION & CURETTAGE/HYSTEROSCOPY WITH MYOSURE N/A 12/16/2018   Procedure: DILATATION & CURETTAGE, Vaginal Partial Myomectomy Prolapsing Cervical  Myoma;  Surgeon: Azucena Fallen, MD;  Location: [place];  Service: Gynecology;  Laterality: N/A;    Past Medical Hx:  Past Medical History:  Diagnosis Date  . Anemia    takes iron  . Cancer (HCC)    endometrial  . Depression   . Hypertension   . Sleep apnea    no cpap     Past Gynecological History:  See HPI No LMP recorded.  Family Hx:  Family History  Problem Relation Age of Onset  . Hyperlipidemia Maternal Aunt   . Cervical cancer Maternal Aunt   . Heart attack Maternal Uncle   . Heart attack Maternal Grandfather   . Lymphoma Cousin      Review of Systems:  Constitutional  Feels well,    ENT Normal appearing ears and nares bilaterally Skin/Breast  No rash, sores, jaundice, itching, dryness Cardiovascular  No chest pain, shortness of breath, or edema  Pulmonary  No cough or wheeze.  Gastro Intestinal  No nausea, vomitting, or diarrhoea. No bright red blood per rectum, no abdominal pain, change in bowel movement, or constipation.  Genito Urinary  No frequency, urgency, dysuria, + bleeding Musculo Skeletal  No myalgia, arthralgia, joint swelling or pain  Neurologic  No weakness, numbness, change in gait,  Psychology  No depression, anxiety, insomnia.   Vitals:  Blood pressure 130/87, pulse 80, temperature 98 F (36.7 C), temperature source Oral, resp. rate 14, height 5\' 10"  (1.778 m), weight (!) 345 lb 6.4 oz (156.7 kg), SpO2 95 %.  Physical Exam: WD in NAD Neck  Supple NROM, without any enlargements.  Lymph Node Survey No cervical supraclavicular or inguinal adenopathy Cardiovascular  Pulse normal rate, regularity and rhythm. S1 and S2 normal.  Lungs  Clear to auscultation bilateraly, without wheezes/crackles/rhonchi. Good air movement.  Skin  No rash/lesions/breakdown  Psychiatry  Alert and oriented to person, place, and time  Abdomen  Normoactive bowel sounds, abdomen soft, non-tender and obese without evidence of hernia.  Back No CVA tenderness Genito Urinary  Vulva/vagina: Normal external female genitalia.   No lesions. No discharge or bleeding.  Bladder/urethra:  No lesions or masses, well supported bladder  Vagina: no lesions  Cervix: soft, not enlarged, slightly dilated  Uterus: difficult to appreciate size due to body habitus. Some fullness in the left lower uterine segment appreciated, mobile, no parametrial involvement or nodularity.  Adnexa: no palpable masses. Rectal  deferred Extremities  No bilateral cyanosis, clubbing or edema.   Thereasa Solo, MD  02/17/2019, 7:06  AM

## 2019-02-17 NOTE — Anesthesia Postprocedure Evaluation (Signed)
Anesthesia Post Note  Patient: Rachel Herrera  Procedure(s) Performed: XI MODIFIED ROBOTIC ASSISTED TOTAL HYSTERECTOMY WITH BILATERAL SALPINGOOPHORECTOMY (Bilateral ) SENTINEL NODE BIOPSY (N/A )     Patient location during evaluation: PACU Anesthesia Type: General Level of consciousness: awake and alert and oriented Pain management: pain level controlled Vital Signs Assessment: post-procedure vital signs reviewed and stable Respiratory status: spontaneous breathing, nonlabored ventilation and respiratory function stable Cardiovascular status: blood pressure returned to baseline Postop Assessment: no apparent nausea or vomiting Anesthetic complications: no    Last Vitals:  Vitals:   02/17/19 1030 02/17/19 1130  BP: (!) 146/77   Pulse: (!) 103   Resp: (!) 24 14  Temp: 36.9 C   SpO2: 100% 98%    Last Pain:  Vitals:   02/17/19 1130  TempSrc:   PainSc: 0-No pain                 Brennan Bailey

## 2019-02-17 NOTE — Transfer of Care (Signed)
Immediate Anesthesia Transfer of Care Note  Patient: Rachel Herrera  Procedure(s) Performed: XI MODIFIED ROBOTIC ASSISTED TOTAL HYSTERECTOMY WITH BILATERAL SALPINGOOPHORECTOMY (Bilateral ) SENTINEL NODE BIOPSY (N/A )  Patient Location: PACU  Anesthesia Type:General  Level of Consciousness: drowsy, patient cooperative and responds to stimulation  Airway & Oxygen Therapy: Patient Spontanous Breathing and Patient connected to face mask oxygen  Post-op Assessment: Report given to RN and Post -op Vital signs reviewed and stable  Post vital signs: Reviewed and stable  Last Vitals:  Vitals Value Taken Time  BP 146/77 02/17/19 1030  Temp    Pulse 99 02/17/19 1031  Resp 11 02/17/19 1031  SpO2 100 % 02/17/19 1031  Vitals shown include unvalidated device data.  Last Pain:  Vitals:   02/17/19 0558  TempSrc: Oral      Patients Stated Pain Goal: 3 ([id number])  Complications: No apparent anesthesia complications

## 2019-02-18 ENCOUNTER — Telehealth: Payer: Self-pay

## 2019-02-18 NOTE — Telephone Encounter (Signed)
Rachel Herrera states that she is doing well. She is eating, drinking, and urinating well. Pass gas. She will begin the [employer]. May increase to bid if No BM by tomorrow mid day. Incisions D&I. She states that the heal of her left hand and thumb is numb since the surgery.  The numbness is gradually decreasing. Told her that this can happen after surgery from positioning on the table. She needs to call if the numbness does not resolve with in the next few days or increases. Pt types for a living. She is aware of her post op appointments and the office number [phone] if she has any further questions or concerns.

## 2019-02-19 ENCOUNTER — Telehealth: Payer: Self-pay

## 2019-02-19 NOTE — Telephone Encounter (Signed)
Rachel Herrera feels that she can return to work ~3 weeks after her surgery on 03-16-19. She sits at a desk most of the day. She will be covered for Out of work from the [place] testing on 02-13-19 as she needed to quarantine prior to surgery. Information given to Joylene John, NP to complete her FMLA form

## 2019-02-20 ENCOUNTER — Other Ambulatory Visit: Payer: Self-pay

## 2019-02-23 ENCOUNTER — Encounter: Payer: Self-pay | Admitting: Osteopathic Medicine

## 2019-02-24 LAB — SURGICAL PATHOLOGY

## 2019-02-26 ENCOUNTER — Encounter: Payer: Self-pay | Admitting: Gynecologic Oncology

## 2019-02-26 ENCOUNTER — Inpatient Hospital Stay: Payer: 59 | Attending: Gynecologic Oncology | Admitting: Gynecologic Oncology

## 2019-02-26 DIAGNOSIS — Z7189 Other specified counseling: Secondary | ICD-10-CM

## 2019-02-26 DIAGNOSIS — E8940 Asymptomatic postprocedural ovarian failure: Secondary | ICD-10-CM | POA: Insufficient documentation

## 2019-02-26 DIAGNOSIS — C541 Malignant neoplasm of endometrium: Secondary | ICD-10-CM

## 2019-02-26 MED ORDER — ESTRADIOL 0.1 MG/24HR TD PTTW: 1.0000 | MEDICATED_PATCH | TRANSDERMAL | 12 refills | Status: DC

## 2019-02-26 NOTE — Progress Notes (Signed)
Gynecologic Oncology Telehealth Consult Note: Gyn-Onc  I connected with Rachel Herrera on 02/26/19 at  3:45 PM EST by telephone and verified that I am speaking with the correct person using two identifiers.  I discussed the limitations, risks, security and privacy concerns of performing an evaluation and management service by telemedicine and the availability of in-person appointments. I also discussed with the patient that there may be a patient responsible charge related to this service. The patient expressed understanding and agreed to proceed.  Other persons participating in the visit and their role in the encounter: none.  Patient's location: home Provider's location: [place]  Chief Complaint:  Chief Complaint  Patient presents with  . endometrial cancer    counseling and coordination    Assessment/Plan:  Rachel Herrera  is a 34 y.o.  year old with stage IA grade 1 endometrioid endometrial adenocarcinoma, s/p staging on 02/16/19. MSI stable/MMR normal.  We will have her pathology reviewed at [facility] given the discordance with preoperative MRI.  I discussed that her pathology revealed low risk factors for recurrence, therefore no adjuvant therapy is recommended according to NCCN guidelines.  I discussed risk for recurrence and typical symptoms encouraged her to notify us of these should they develop between visits.  I recommend she have follow-up every 6 months for 5 years in accordance with NCCN guidelines. Those visits should include symptom assessment, physical exam and pelvic examination. Pap smears are not indicated or recommended in the routine surveillance of endometrial cancer.  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 Recommended estrogen replacement therapy. Discussed risks of VTE. Prefers transdermal route - Rx sent for vivelle dot.   HPI: [name] is a 34 year old P0 who was seen in consultation at the request of Dr Benjie Karvonen  for evaluation of FIGO grade 1 endometrial cancer.   The patient reported a longstanding history of menorrhagia with irregular heavy periods.  This culminated in admission to [facility]) where she was diagnosed with severe anemia, required ICU admission and transfusion of 9 units of PRBC for what she thought was a Hb of 2.9.  She had not seen a gynecologist for approximately 10 years, but after this discharge was seen by Dr Benjie Karvonen in October, 2020. At that time a pap was performed on 11/23/18 which revealed atypical glandular cells.  An ultrasound was performed on 12/02/18 which showed a 5.5 x 3.9x 3.3cm fibroid along the posterior cervical edge extending into the endocervical canal. The fundal endometrium was also thickened. The ovaries were grossly normal.   She was taken to the OR on 12/16/18 for vaginal myomectomy.  Intraoperative findings were significant for a myoma delivering through the cervix that was debulked to less than half its size but with a wide stalk could not facilitate complete excision.  She had minimal bleeding postoperatively.  Final pathology returned as a FIGO grade 1 endometrioid adenocarcinoma.  CT scan of the abdomen and pelvis was performed on December 24, 2018 which revealed mildly enlarged left iliac chain lymph node measuring 16 mm.  There was a mildly enlarged portacaval lymph node measuring 17 mm.  They were felt to be likely reactive.  The uterus was anteverted and grossly unremarkable.  The ovaries were also grossly unremarkable.  The patient's medical history is not remarkable as she has had limited medical care.  She has morbid obesity with a BMI of 50 kg meters squared.  She denies prior testing for diabetes mellitus.  She has had some mild hypertension.  She is never been pregnant.  She is ambivalent about childbearing.  She is married to a man from [place].  She has no family history significant for gynecologic cancers.  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  MRI pelvis was performed on 01/19/19 which showed a poorly marginated infiltrative appearing central uterine mass at the junction of the lower uterine cavity and uterine cervix, 3.0 x 2.1 x 2.9 cm, with invasion of the cervical fibrous stroma, which is near full-thickness and nodular appearing in the left uterine cervix. No frank parametrial or vaginal invasion. MRI staging at least FIGO stage II. Indeterminate bilateral external iliac and left common iliac lymphadenopathy, metastatic disease not excluded. Diffuse uterine adenomyosis. Normal ovaries.  No adnexal masses..  Interval Hx:  On 02/17/19 she underwent a robotic assisted modified radical hysterectomy, BSO, SLN biopsy. The left parametrium was taken due to concern for cervical stromal involvement on preoperative imaging. All nodes that were clinically suspicious were removed.   Final pathology revealed a 0.5cm FIGO grade 1 tumor with inner half myo invasion (8 of 63m) with no LVSI and no involvement of the adnexa or cervix. All 4 lymph nodes were negative.  MSI stable/MMR normal.  She was determined to have low risk features and in accordance with NCCN guidelines, no adjuvant therapy was recommended.  Since surgery she has done well with no complaints.   Current Meds:  Outpatient Encounter Medications as of 02/26/2019  Medication Sig  . AMBULATORY NON FORMULARY MEDICATION Supply ordered: CPAP and other supplies needed (headgear, cushions, filters, heated tuubing and water chamber) Dx: obstructive sleep apnea Settings: auto-titration 5-20 cmH2O  . Cholecalciferol (VITAMIN D) 125 MCG (5000 UT) CAPS Take 5,000 Units by mouth every other day.  . estradiol (VIVELLE-DOT) 0.1 MG/24HR patch Place 1 patch (0.1 mg total) onto the skin 2 (two) times a week.  . ferrous sulfate (FERROUSUL) 325 (65 FE) MG tablet Take 1 tablet (325 mg total) by mouth daily with breakfast.  . hydrochlorothiazide (HYDRODIURIL) 25  MG tablet Take 1 tablet (25 mg total) by mouth daily.  .Marland Kitchenibuprofen (ADVIL) 800 MG tablet Take 1 tablet (800 mg total) by mouth every 8 (eight) hours as needed for moderate pain. For AFTER surgery  . Melatonin 5 MG TABS Take 2.5-5 mg by mouth at bedtime as needed (sleep).   .Marland KitchenoxyCODONE (OXY IR/ROXICODONE) 5 MG immediate release tablet Take 1 tablet (5 mg total) by mouth every 4 (four) hours as needed for severe pain. For AFTER surgery only, do not take and drive  . senna-docusate (SENOKOT-S) 8.6-50 MG tablet Take 2 tablets by mouth at bedtime. For AFTER surgery, do not take if having diarrhea   No facility-administered encounter medications on file as of 02/26/2019.    Allergy: No Known Allergies  Social Hx:   Social History   Socioeconomic History  . Marital status: Married    Spouse name: Not on file  . Number of children: Not on file  . Years of education: Not on file  . Highest education level: Not on file  Occupational History  . Not on file  Tobacco Use  . Smoking status: Former Smoker    Quit date: 12/12/2015    Years since quitting: 3.2  . Smokeless tobacco: Never Used  Substance and Sexual Activity  . Alcohol use: Yes    Comment: weekends  . Drug use: No  . Sexual activity: Yes  Birth control/protection: Condom  Other Topics Concern  . Not on file  Social History Narrative  . Not on file   Social Determinants of Health   Financial Resource Strain:   . Difficulty of Paying Living Expenses: Not on file  Food Insecurity:   . Worried About Charity fundraiser in the Last Year: Not on file  . Ran Out of Food in the Last Year: Not on file  Transportation Needs:   . Lack of Transportation (Medical): Not on file  . Lack of Transportation (Non-Medical): Not on file  Physical Activity:   . Days of Exercise per Week: Not on file  . Minutes of Exercise per Session: Not on file  Stress:   . Feeling of Stress : Not on file  Social Connections:   . Frequency of  Communication with Friends and Family: Not on file  . Frequency of Social Gatherings with Friends and Family: Not on file  . Attends Religious Services: Not on file  . Active Member of Clubs or Organizations: Not on file  . Attends Archivist Meetings: Not on file  . Marital Status: Not on file  Intimate Partner Violence:   . Fear of Current or Ex-Partner: Not on file  . Emotionally Abused: Not on file  . Physically Abused: Not on file  . Sexually Abused: Not on file    Past Surgical Hx:  Past Surgical History:  Procedure Laterality Date  . DILATATION & CURETTAGE/HYSTEROSCOPY WITH MYOSURE N/A 12/16/2018   Procedure: DILATATION & CURETTAGE, Vaginal Partial Myomectomy Prolapsing Cervical  Myoma;  Surgeon: Azucena Fallen, MD;  Location: [place];  Service: Gynecology;  Laterality: N/A;  . ROBOTIC ASSISTED TOTAL HYSTERECTOMY Bilateral 02/17/2019   Procedure: XI MODIFIED ROBOTIC ASSISTED TOTAL HYSTERECTOMY WITH BILATERAL SALPINGOOPHORECTOMY;  Surgeon: Everitt Amber, MD;  Location: [facility];  Service: Gynecology;  Laterality: Bilateral;  . SENTINEL NODE BIOPSY N/A 02/17/2019   Procedure: SENTINEL NODE BIOPSY;  Surgeon: Everitt Amber, MD;  Location: [facility];  Service: Gynecology;  Laterality: N/A;    Past Medical Hx:  Past Medical History:  Diagnosis Date  . Anemia    takes iron  . Cancer (HCC)    endometrial  . Depression   . Hypertension   . Sleep apnea    no cpap     Past Gynecological History:  See HPI No LMP recorded.  Family Hx:  Family History  Problem Relation Age of Onset  . Hyperlipidemia Maternal Aunt   . Cervical cancer Maternal Aunt   . Heart attack Maternal Uncle   . Heart attack Maternal Grandfather   . Lymphoma Cousin     Review of Systems:  Constitutional  Feels well,    ENT Normal appearing ears and nares bilaterally Skin/Breast  No rash, sores, jaundice, itching, dryness Cardiovascular  No chest pain, shortness of breath, or edema  Pulmonary  No  cough or wheeze.  Gastro Intestinal  No nausea, vomitting, or diarrhoea. No bright red blood per rectum, no abdominal pain, change in bowel movement, or constipation.  Genito Urinary  No frequency, urgency, dysuria, + bleeding Musculo Skeletal  No myalgia, arthralgia, joint swelling or pain  Neurologic  No weakness, numbness, change in gait,  Psychology  No depression, anxiety, insomnia.   Vitals:  There were no vitals taken for this visit.  Physical Exam: Deferred  I discussed the assessment and treatment plan with the patient. The patient was provided with an opportunity to ask questions and all were answered.  The patient agreed with the plan and demonstrated an understanding of the instructions.   The patient was advised to call back or see an in-person evaluation if the symptoms worsen or if the condition fails to improve as anticipated.   I provided 15 minutes of non face-to-face telephone visit time during this encounter, and > 50% was spent counseling as documented under my assessment & plan.    Thereasa Solo, MD  02/26/2019, 3:59 PM

## 2019-02-27 ENCOUNTER — Encounter (HOSPITAL_COMMUNITY): Payer: Self-pay | Admitting: Gynecologic Oncology

## 2019-03-10 ENCOUNTER — Other Ambulatory Visit: Payer: Self-pay

## 2019-03-10 ENCOUNTER — Inpatient Hospital Stay: Payer: 59 | Attending: Gynecologic Oncology | Admitting: Gynecologic Oncology

## 2019-03-10 ENCOUNTER — Encounter: Payer: Self-pay | Admitting: Gynecologic Oncology

## 2019-03-10 VITALS — BP 137/72 | HR 87 | Temp 98.0°F | Resp 18 | Ht 70.0 in | Wt 341.2 lb

## 2019-03-10 DIAGNOSIS — Z6841 Body Mass Index (BMI) 40.0 and over, adult: Secondary | ICD-10-CM | POA: Insufficient documentation

## 2019-03-10 DIAGNOSIS — Z79899 Other long term (current) drug therapy: Secondary | ICD-10-CM | POA: Diagnosis not present

## 2019-03-10 DIAGNOSIS — Z90722 Acquired absence of ovaries, bilateral: Secondary | ICD-10-CM | POA: Insufficient documentation

## 2019-03-10 DIAGNOSIS — Z90710 Acquired absence of both cervix and uterus: Secondary | ICD-10-CM | POA: Insufficient documentation

## 2019-03-10 DIAGNOSIS — I10 Essential (primary) hypertension: Secondary | ICD-10-CM | POA: Insufficient documentation

## 2019-03-10 DIAGNOSIS — Z7189 Other specified counseling: Secondary | ICD-10-CM

## 2019-03-10 DIAGNOSIS — C541 Malignant neoplasm of endometrium: Secondary | ICD-10-CM | POA: Diagnosis present

## 2019-03-10 DIAGNOSIS — D649 Anemia, unspecified: Secondary | ICD-10-CM | POA: Insufficient documentation

## 2019-03-10 NOTE — Patient Instructions (Signed)
Please notify Dr Denman George at phone number [phone] if you notice vaginal bleeding, new pelvic or abdominal pains, bloating, feeling full easy, or a change in bladder or bowel function.   Please contact Dr Serita Grit office (at [phone]) in April, 2021 to request an appointment with her for October, 2021. Please follow-up with Dr Gardiner Coins office for an annual wellness check in 12 months.

## 2019-03-10 NOTE — Progress Notes (Signed)
Gynecologic Oncology Follow-up Note  Chief Complaint:  Chief Complaint  Patient presents with  . Endometrial cancer [facility])    Assessment/Plan:  Rachel. Rachel Herrera  is a 34 y.o.  year old with stage IA grade 1 endometrioid endometrial adenocarcinoma, s/p staging on 02/16/19. MSI stable/MMR normal.  We will have her pathology reviewed at [facility] given the discordance with preoperative MRI.  I discussed that her pathology revealed low risk factors for recurrence, therefore no adjuvant therapy is recommended according to NCCN guidelines.  I discussed risk for recurrence and typical symptoms encouraged her to notify us of these should they develop between visits.  I recommend she have follow-up every 6 months for 5 years in accordance with NCCN guidelines. Those visits should include symptom assessment, physical exam and pelvic examination. Pap smears are not indicated or recommended in the routine surveillance of endometrial cancer.  She will likely need to see genetics due to her young age at diagnosis.   HPI: Rachel Herrera is a 34 year old P0 who was seen in consultation at the request of Dr Benjie Karvonen for evaluation of FIGO grade 1 endometrial cancer.   The patient reported a longstanding history of menorrhagia with irregular heavy periods.  This culminated in admission to [facility]) where she was diagnosed with severe anemia, required ICU admission and transfusion of 9 units of PRBC for what she thought was a Hb of 2.9.  She had not seen a gynecologist for approximately 10 years, but after this discharge was seen by Dr Benjie Karvonen in October, 2020. At that time a pap was performed on 11/23/18 which revealed atypical glandular cells.  An ultrasound was performed on 12/02/18 which showed a 5.5 x 3.9x 3.3cm fibroid along the posterior cervical edge extending into the endocervical canal. The fundal endometrium was also thickened. The ovaries were grossly normal.   She was  taken to the OR on 12/16/18 for vaginal myomectomy.  Intraoperative findings were significant for a myoma delivering through the cervix that was debulked to less than half its size but with a wide stalk could not facilitate complete excision.  She had minimal bleeding postoperatively.  Final pathology returned as a FIGO grade 1 endometrioid adenocarcinoma.  CT scan of the abdomen and pelvis was performed on December 24, 2018 which revealed mildly enlarged left iliac chain lymph node measuring 16 mm.  There was a mildly enlarged portacaval lymph node measuring 17 mm.  They were felt to be likely reactive.  The uterus was anteverted and grossly unremarkable.  The ovaries were also grossly unremarkable.  The patient's medical history is not remarkable as she has had limited medical care.  She has morbid obesity with a BMI of 50 kg meters squared.  She denies prior testing for diabetes mellitus.  She has had some mild hypertension.  She is never been pregnant.  She is ambivalent about childbearing.  She is married to a man from [place].  She has no family history significant for gynecologic cancers.  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  MRI pelvis was performed on 01/19/19 which showed a poorly marginated infiltrative appearing central uterine mass at the junction of the lower uterine cavity and uterine cervix, 3.0 x 2.1 x 2.9 cm, with invasion of the cervical fibrous stroma, which is near full-thickness and nodular appearing in the left uterine cervix. No frank parametrial or vaginal invasion. MRI staging at least FIGO stage II. Indeterminate bilateral external iliac and left common  iliac lymphadenopathy, metastatic disease not excluded. Diffuse uterine adenomyosis. Normal ovaries.  No adnexal masses..  Interval Hx:  On 02/17/19 she underwent a robotic assisted modified radical hysterectomy, BSO, SLN biopsy. The left parametrium was taken due to concern for cervical stromal involvement  on preoperative imaging. All nodes that were clinically suspicious were removed.   Final pathology revealed a 0.5cm FIGO grade 1 tumor with inner half myo invasion (8 of 19m) with no LVSI and no involvement of the adnexa or cervix. All 4 lymph nodes were negative.  MSI stable/MMR normal.  She was determined to have low risk features and in accordance with NCCN guidelines, no adjuvant therapy was recommended.  Since surgery she has done well with no complaints.   Current Meds:  Outpatient Encounter Medications as of 03/10/2019  Medication Sig  . AMBULATORY NON FORMULARY MEDICATION Supply ordered: CPAP and other supplies needed (headgear, cushions, filters, heated tuubing and water chamber) Dx: obstructive sleep apnea Settings: auto-titration 5-20 cmH2O  . Cholecalciferol (VITAMIN D) 125 MCG (5000 UT) CAPS Take 5,000 Units by mouth every other day.  . estradiol (VIVELLE-DOT) 0.1 MG/24HR patch Place 1 patch (0.1 mg total) onto the skin 2 (two) times a week.  . ferrous sulfate (FERROUSUL) 325 (65 FE) MG tablet Take 1 tablet (325 mg total) by mouth daily with breakfast.  . hydrochlorothiazide (HYDRODIURIL) 25 MG tablet Take 1 tablet (25 mg total) by mouth daily.  . Melatonin 5 MG TABS Take 2.5-5 mg by mouth at bedtime as needed (sleep).   . [DISCONTINUED] ibuprofen (ADVIL) 800 MG tablet Take 1 tablet (800 mg total) by mouth every 8 (eight) hours as needed for moderate pain. For AFTER surgery  . [DISCONTINUED] oxyCODONE (OXY IR/ROXICODONE) 5 MG immediate release tablet Take 1 tablet (5 mg total) by mouth every 4 (four) hours as needed for severe pain. For AFTER surgery only, do not take and drive  . [DISCONTINUED] senna-docusate (SENOKOT-S) 8.6-50 MG tablet Take 2 tablets by mouth at bedtime. For AFTER surgery, do not take if having diarrhea   No facility-administered encounter medications on file as of 03/10/2019.    Allergy: No Known Allergies  Social Hx:   Social History   Socioeconomic  History  . Marital status: Married    Spouse name: Not on file  . Number of children: Not on file  . Years of education: Not on file  . Highest education level: Not on file  Occupational History  . Not on file  Tobacco Use  . Smoking status: Former Smoker    Quit date: 12/12/2015    Years since quitting: 3.2  . Smokeless tobacco: Never Used  Substance and Sexual Activity  . Alcohol use: Yes    Comment: weekends  . Drug use: No  . Sexual activity: Yes    Birth control/protection: Condom  Other Topics Concern  . Not on file  Social History Narrative  . Not on file   Social Determinants of Health   Financial Resource Strain:   . Difficulty of Paying Living Expenses: Not on file  Food Insecurity:   . Worried About RCharity fundraiserin the Last Year: Not on file  . Ran Out of Food in the Last Year: Not on file  Transportation Needs:   . Lack of Transportation (Medical): Not on file  . Lack of Transportation (Non-Medical): Not on file  Physical Activity:   . Days of Exercise per Week: Not on file  . Minutes of Exercise per Session:  Not on file  Stress:   . Feeling of Stress : Not on file  Social Connections:   . Frequency of Communication with Friends and Family: Not on file  . Frequency of Social Gatherings with Friends and Family: Not on file  . Attends Religious Services: Not on file  . Active Member of Clubs or Organizations: Not on file  . Attends Archivist Meetings: Not on file  . Marital Status: Not on file  Intimate Partner Violence:   . Fear of Current or Ex-Partner: Not on file  . Emotionally Abused: Not on file  . Physically Abused: Not on file  . Sexually Abused: Not on file    Past Surgical Hx:  Past Surgical History:  Procedure Laterality Date  . DILATATION & CURETTAGE/HYSTEROSCOPY WITH MYOSURE N/A 12/16/2018   Procedure: DILATATION & CURETTAGE, Vaginal Partial Myomectomy Prolapsing Cervical  Myoma;  Surgeon: Azucena Fallen, MD;  Location:  [facility];  Service: Gynecology;  Laterality: N/A;  . ROBOTIC ASSISTED TOTAL HYSTERECTOMY Bilateral 02/17/2019   Procedure: XI MODIFIED ROBOTIC ASSISTED TOTAL HYSTERECTOMY WITH BILATERAL SALPINGOOPHORECTOMY;  Surgeon: Everitt Amber, MD;  Location: [facility];  Service: Gynecology;  Laterality: Bilateral;  . SENTINEL NODE BIOPSY N/A 02/17/2019   Procedure: SENTINEL NODE BIOPSY;  Surgeon: Everitt Amber, MD;  Location: [facility];  Service: Gynecology;  Laterality: N/A;    Past Medical Hx:  Past Medical History:  Diagnosis Date  . Anemia    takes iron  . Cancer (HCC)    endometrial  . Depression   . Hypertension   . Sleep apnea    no cpap     Past Gynecological History:  See HPI No LMP recorded.  Family Hx:  Family History  Problem Relation Age of Onset  . Hyperlipidemia Maternal Aunt   . Cervical cancer Maternal Aunt   . Heart attack Maternal Uncle   . Heart attack Maternal Grandfather   . Lymphoma Cousin     Review of Systems:  Constitutional  Feels well,    ENT Normal appearing ears and nares bilaterally Skin/Breast  No rash, sores, jaundice, itching, dryness Cardiovascular  No chest pain, shortness of breath, or edema  Pulmonary  No cough or wheeze.  Gastro Intestinal  No nausea, vomitting, or diarrhoea. No bright red blood per rectum, no abdominal pain, change in bowel movement, or constipation.  Genito Urinary  No frequency, urgency, dysuria, + bleeding Musculo Skeletal  No myalgia, arthralgia, joint swelling or pain  Neurologic  No weakness, numbness, change in gait,  Psychology  No depression, anxiety, insomnia.   Vitals:  Blood pressure 137/72, pulse 87, temperature 98 F (36.7 C), temperature source Temporal, resp. rate 18, height '5\' 10"'$  (1.778 m), weight (!) 341 lb 3.2 oz (154.8 kg), SpO2 98 %.  Physical Exam: WD in NAD Neck  Supple NROM, without any enlargements.  Lymph Node Survey No cervical supraclavicular or inguinal adenopathy Cardiovascular  Pulse normal  rate, regularity and rhythm. S1 and S2 normal.  Lungs  Clear to auscultation bilateraly, without wheezes/crackles/rhonchi. Good air movement.  Skin  No rash/lesions/breakdown  Psychiatry  Alert and oriented to person, place, and time  Abdomen  Normoactive bowel sounds, abdomen soft, non-tender and obese without evidence of hernia. Well healed incisions Back No CVA tenderness Genito Urinary  Vulva/vagina: Normal external female genitalia.   No lesions. No discharge or bleeding.  Bladder/urethra:  No lesions or masses, well supported bladder  Vagina: normal vaginal cuff healing without bleeding or lesions. Rectal  deferred Extremities  No bilateral cyanosis, clubbing or edema.  30 minutes of direct face to face counseling time was spent with the patient. This included discussion about prognosis, therapy recommendations and postoperative side effects and are beyond the scope of routine postoperative care.  Thereasa Solo, MD  03/10/2019, 5:00 PM

## 2019-03-16 ENCOUNTER — Telehealth: Payer: Self-pay | Admitting: Oncology

## 2019-03-16 ENCOUNTER — Other Ambulatory Visit: Payer: Self-pay | Admitting: Oncology

## 2019-03-16 ENCOUNTER — Encounter: Payer: Self-pay | Admitting: Oncology

## 2019-03-16 DIAGNOSIS — C541 Malignant neoplasm of endometrium: Secondary | ICD-10-CM

## 2019-03-16 NOTE — Progress Notes (Signed)
Gynecologic Oncology Multi-Disciplinary Disposition Conference Note  Date of the Conference: 03/16/2019  Patient Name: Rachel Herrera  Referring Provider: Dr. Benjie Karvonen Primary GYN Oncologist: Dr. Denman George  Stage/Disposition:  Stage IA, grade 1 endometrioid endometrial adenocarcinoma. Disposition is to genetic counseling.   This Multidisciplinary conference took place involving physicians from [place], [place], Radiation Oncology, Pathology, Radiology along with the Gynecologic Oncology Nurse Practitioner and RN.  Comprehensive assessment of the patient's malignancy, staging, need for surgery, chemotherapy, radiation therapy, and need for further testing were reviewed. Supportive measures, both inpatient and following discharge were also discussed. The recommended plan of care is documented. Greater than 35 minutes were spent correlating and coordinating this patient's care.

## 2019-03-16 NOTE — Telephone Encounter (Signed)
Called Rachel Herrera and scheduled genetic counseling for 03/25/19 at 9 am.

## 2019-03-19 ENCOUNTER — Telehealth: Payer: Self-pay | Admitting: Oncology

## 2019-03-19 NOTE — Telephone Encounter (Signed)
Called Rachel Herrera and changed her genetic counseling appointment to a virtual visit.

## 2019-03-25 ENCOUNTER — Telehealth: Payer: Self-pay | Admitting: Genetic Counselor

## 2019-03-25 ENCOUNTER — Inpatient Hospital Stay (HOSPITAL_BASED_OUTPATIENT_CLINIC_OR_DEPARTMENT_OTHER): Payer: 59 | Admitting: Genetic Counselor

## 2019-03-25 ENCOUNTER — Encounter: Payer: Self-pay | Admitting: Genetic Counselor

## 2019-03-25 ENCOUNTER — Encounter: Payer: 59 | Admitting: Genetic Counselor

## 2019-03-25 DIAGNOSIS — Z808 Family history of malignant neoplasm of other organs or systems: Secondary | ICD-10-CM | POA: Diagnosis not present

## 2019-03-25 DIAGNOSIS — Z800 Family history of malignant neoplasm of digestive organs: Secondary | ICD-10-CM

## 2019-03-25 DIAGNOSIS — C55 Malignant neoplasm of uterus, part unspecified: Secondary | ICD-10-CM | POA: Diagnosis not present

## 2019-03-25 NOTE — Telephone Encounter (Signed)
error 

## 2019-03-25 NOTE — Progress Notes (Addendum)
REFERRING PROVIDER: Everitt Amber, MD [place],  [place] [address]  PRIMARY PROVIDER:  Emeterio Reeve, DO  PRIMARY REASON FOR VISIT:  1. Malignant neoplasm of uterus, unspecified site ([place])   2. Family history of colon cancer   3. Family history of brain cancer      HISTORY OF PRESENT ILLNESS:  I connected with  Rachel Herrera on 03/25/2019 at 9 AM EDT by MyChart video conference and verified that I am speaking with the correct person using two identifiers.   Patient location: Home Provider location: [facility]  Rachel Herrera, a 34 y.o. female, was seen for a [place] cancer genetics consultation at the request of Dr. Denman George due to a personal and family history of cancer.  Rachel Herrera presents to clinic today to discuss the possibility of a hereditary predisposition to cancer, genetic testing, and to further clarify her future cancer risks, as well as potential cancer risks for family members.   On December 17, 2018, at the age of 64, Rachel Herrera was diagnosed with Stage I cancer of the endometrium. The treatment plan total abdominal hysterectomy with bilateral salpingo-oophorectomy.  MSI was stable.    CANCER HISTORY:  Oncology History   No history exists.     RISK FACTORS:  Menarche was at age 19.  First live birth at age N/A.  OCP use for approximately 2-3 years.  Ovaries intact: no.  Hysterectomy: yes.  Menopausal status: postmenopausal.  HRT use: just started estradiol . Colonoscopy: no; not examined. Mammogram within the last year: no. Number of breast biopsies: 0. Up to date with pelvic exams: yes. Any excessive radiation exposure in the past: no  Past Medical History:  Diagnosis Date  . Anemia    takes iron  . Cancer (HCC)    endometrial  . Depression   . Family history of brain cancer   . Family history of colon cancer   . Hypertension   . Sleep apnea    no cpap     Past Surgical History:  Procedure Laterality  Date  . DILATATION & CURETTAGE/HYSTEROSCOPY WITH MYOSURE N/A 12/16/2018   Procedure: DILATATION & CURETTAGE, Vaginal Partial Myomectomy Prolapsing Cervical  Myoma;  Surgeon: Azucena Fallen, MD;  Location: [facility];  Service: Gynecology;  Laterality: N/A;  . ROBOTIC ASSISTED TOTAL HYSTERECTOMY Bilateral 02/17/2019   Procedure: XI MODIFIED ROBOTIC ASSISTED TOTAL HYSTERECTOMY WITH BILATERAL SALPINGOOPHORECTOMY;  Surgeon: Everitt Amber, MD;  [facility];  Service: Gynecology;  Laterality: Bilateral;  . SENTINEL NODE BIOPSY N/A 02/17/2019   Procedure: SENTINEL NODE BIOPSY;  Surgeon: Everitt Amber, MD;  [facility];  Service: Gynecology;  Laterality: N/A;    Social History   Socioeconomic History  . Marital status: Married    Spouse name: Not on file  . Number of children: Not on file  . Years of education: Not on file  . Highest education level: Not on file  Occupational History  . Not on file  Tobacco Use  . Smoking status: Former Smoker    Quit date: 12/12/2015    Years since quitting: 3.2  . Smokeless tobacco: Never Used  Substance and Sexual Activity  . Alcohol use: Yes    Comment: weekends  . Drug use: No  . Sexual activity: Yes    Birth control/protection: Condom  Other Topics Concern  . Not on file  Social History Narrative  . Not on file   Social Determinants of Health   Financial Resource Strain:   .  Difficulty of Paying Living Expenses: Not on file  Food Insecurity:   . Worried About Charity fundraiser in the Last Year: Not on file  . Ran Out of Food in the Last Year: Not on file  Transportation Needs:   . Lack of Transportation (Medical): Not on file  . Lack of Transportation (Non-Medical): Not on file  Physical Activity:   . Days of Exercise per Week: Not on file  . Minutes of Exercise per Session: Not on file  Stress:   . Feeling of Stress : Not on file  Social Connections:   . Frequency of Communication with Friends and Family: Not on file  . Frequency of  Social Gatherings with Friends and Family: Not on file  . Attends Religious Services: Not on file  . Active Member of Clubs or Organizations: Not on file  . Attends Archivist Meetings: Not on file  . Marital Status: Not on file     FAMILY HISTORY:  We obtained a detailed, 4-generation family history.  Significant diagnoses are listed below: Family History  Problem Relation Age of Onset  . Hyperlipidemia Maternal Aunt   . Cervical cancer Maternal Aunt 38  . Heart attack Maternal Uncle   . Brain cancer Maternal Uncle 65  . Heart attack Maternal Grandfather   . Lymphoma Cousin   . Polycythemia Maternal Grandmother   . Dementia Paternal Grandmother   . Lung cancer Maternal Uncle     The patient does not have children.  She has a brother and sister who are cancer free.  Her parents are living.  Her mother is 76 and has not had cancer.  She had three brothers and two sisters.  One brother died in the [place] war, one brother died of lung cancer and one brother died of a brain cancer.  One sister died of natural causes, and the other sister who is living had cervical cancer in her 64's.  This sister has a daughter who had lymphoma in her 55's and colon cancer in her early 71's.  The patient's father had four sisters.  There is no reported cancer history on his side of the family.  Rachel Herrera is unaware of previous family history of genetic testing for hereditary cancer risks. Patient's maternal ancestors are of [place] descent, and paternal ancestors are of [place] descent. There is no reported Ashkenazi Jewish ancestry. There is no known consanguinity.    GENETIC COUNSELING ASSESSMENT: Rachel Herrera is a 34 y.o. female with a personal and family history of cancer which is somewhat suggestive of a Lynch syndrome and predisposition to cancer given her young age of onset of uterine cancer. We, therefore, discussed and recommended the following at today's visit.    DISCUSSION: We discussed that 3 - 5% of uterine cancer is hereditary, with most cases associated with Lynch syndrome.  There are other genes that can be associated with hereditary uterine cancer syndromes.  These include PTEN (Cowden syndrome).  We discussed that testing is beneficial for several reasons including knowing how to follow individuals after completing their treatment and understand if other family members could be at risk for cancer and allow them to undergo genetic testing.   We reviewed the characteristics, features and inheritance patterns of hereditary cancer syndromes. We also discussed genetic testing, including the appropriate family members to test, the process of testing, insurance coverage and turn-around-time for results. We discussed the implications of a negative, positive, carrier and/or variant of  uncertain significant result. We recommended Rachel Herrera pursue genetic testing for the common hereditary cancer gene panel. The Common Hereditary Gene Panel offered by [facility] includes sequencing and/or deletion duplication testing of the following 48 genes: APC, ATM, AXIN2, BARD1, BMPR1A, BRCA1, BRCA2, BRIP1, CDH1, CDK4, CDKN2A (p14ARF), CDKN2A (p16INK4a), CHEK2, CTNNA1, DICER1, EPCAM (Deletion/duplication testing only), GREM1 (promoter region deletion/duplication testing only), KIT, MEN1, MLH1, MSH2, MSH3, MSH6, MUTYH, NBN, NF1, NHTL1, PALB2, PDGFRA, PMS2, POLD1, POLE, PTEN, RAD50, RAD51C, RAD51D, RNF43, SDHB, SDHC, SDHD, SMAD4, SMARCA4. STK11, TP53, TSC1, TSC2, and VHL.  The following genes were evaluated for sequence changes only: SDHA and HOXB13 c.251G>A variant only.   Based on Rachel Herrera's personal and family history of cancer, she meets medical criteria for genetic testing. Despite that she meets criteria, she may still have an out of pocket cost. We discussed that if her out of pocket cost for testing is over $100, the laboratory will call and confirm whether she wants  to proceed with testing.  If the out of pocket cost of testing is less than $100 she will be billed by the genetic testing laboratory.   PLAN: After considering the risks, benefits, and limitations, Rachel Herrera provided informed consent to pursue genetic testing.  A saliva kit will be sent to the patient and she will be responsible to complete and return the kit to [facility] for analysis of the common hereditary cancer panel. Results should be available within approximately 2-3 weeks' time, at which point they will be disclosed by telephone to Rachel Herrera, as will any additional recommendations warranted by these results. Rachel Herrera will receive a summary of her genetic counseling visit and a copy of her results once available. This information will also be available in Epic.   Lastly, we encouraged Rachel Herrera to remain in contact with cancer genetics annually so that we can continuously update the family history and inform her of any changes in cancer genetics and testing that may be of benefit for this family.   Rachel Herrera questions were answered to her satisfaction today. Our contact information was provided should additional questions or concerns arise. Thank you for the referral and allowing [place] to share in the care of your patient.   Majesty Oehlert P. Florene [place], [place], [facility] Licensed, Insurance risk surveyor [email or web address] phone: [phone]  The patient was seen for a total of 45 minutes in face-to-face genetic counseling.  This patient was discussed with Drs. Magrinat, Lindi Adie and/or Burr Medico who agrees with the above.    _______________________________________________________________________ For Office Staff:  Number of people involved in session: 1 Was an Intern/ student involved with case: no

## 2019-04-11 ENCOUNTER — Other Ambulatory Visit: Payer: Self-pay | Admitting: Osteopathic Medicine

## 2019-06-09 ENCOUNTER — Other Ambulatory Visit: Payer: Self-pay | Admitting: Gynecologic Oncology

## 2019-06-09 DIAGNOSIS — E8940 Asymptomatic postprocedural ovarian failure: Secondary | ICD-10-CM

## 2019-07-12 ENCOUNTER — Other Ambulatory Visit: Payer: Self-pay | Admitting: Osteopathic Medicine

## 2019-08-10 ENCOUNTER — Other Ambulatory Visit: Payer: Self-pay | Admitting: Osteopathic Medicine

## 2019-08-12 ENCOUNTER — Ambulatory Visit (INDEPENDENT_AMBULATORY_CARE_PROVIDER_SITE_OTHER): Payer: 59 | Admitting: Osteopathic Medicine

## 2019-08-12 ENCOUNTER — Encounter: Payer: Self-pay | Admitting: Osteopathic Medicine

## 2019-08-12 VITALS — BP 130/81 | HR 65 | Temp 97.0°F | Wt 356.0 lb

## 2019-08-12 DIAGNOSIS — F320 Major depressive disorder, single episode, mild: Secondary | ICD-10-CM | POA: Diagnosis not present

## 2019-08-12 DIAGNOSIS — M6530 Trigger finger, unspecified finger: Secondary | ICD-10-CM

## 2019-08-12 DIAGNOSIS — M7581 Other shoulder lesions, right shoulder: Secondary | ICD-10-CM | POA: Diagnosis not present

## 2019-08-12 MED ORDER — MELOXICAM 7.5 MG PO TABS: 7.5000 mg | ORAL_TABLET | Freq: Every day | ORAL | 0 refills | Status: DC

## 2019-08-12 MED ORDER — BUPROPION HCL ER (XL) 150 MG PO TB24: 150.0000 mg | ORAL_TABLET | Freq: Every day | ORAL | 0 refills | Status: DC

## 2019-08-12 NOTE — Progress Notes (Signed)
HPI: Rachel Herrera is a 34 y.o. female who  has a past medical history of Anemia, Cancer ([place]), Depression, Family history of brain cancer, Family history of colon cancer, Hypertension, and Sleep apnea.  she presents to [facility] today, 08/12/19,  for chief complaint of:  Mental health / depression Shoulder pain  Fingers locking       ASSESSMENT/PLAN: The primary encounter diagnosis was Current mild episode of major depressive disorder, unspecified whether recurrent ([place]). Diagnoses of Trigger finger, unspecified finger, unspecified laterality and Right rotator cuff tendonitis were also pertinent to this visit.  1. Current mild episode of major depressive disorder, unspecified whether recurrent (HCC) Weight loss also a goal, no previous meds Would consider counseling if schedule permits --> start Wellbutrin today 08/12/19, MyCHart reminder to check in in 2 weeks on progress, f/u 4 weeks or so Depression screen [facility] 2/9 08/12/2019  Decreased Interest 3  Down, Depressed, Hopeless 2  PHQ - 2 Score 5  Altered sleeping 3  Tired, decreased energy 2  Change in appetite 2  Feeling bad or failure about yourself  1  Trouble concentrating 1  Moving slowly or fidgety/restless 0  Suicidal thoughts 0  PHQ-9 Score 14   GAD 7 : Generalized Anxiety Score 08/12/2019  Nervous, Anxious, on Edge 1  Control/stop worrying 0  Worry too much - different things 0  Trouble relaxing 2  Restless 0  Easily annoyed or irritable 2  Afraid - awful might happen 0  Total GAD 7 Score 5       2. Trigger finger, unspecified finger, unspecified laterality Showed bracing technique w/ bandage   3. Right rotator cuff tendonitis Home exercises printed  Fu sports med or PT if no better   No orders of the defined types were placed in this encounter.    Meds ordered this encounter  Medications  . buPROPion (WELLBUTRIN XL) 150 MG 24 hr tablet    Sig: Take 1 tablet  (150 mg total) by mouth daily.    Dispense:  90 tablet    Refill:  0  . meloxicam (MOBIC) 7.5 MG tablet    Sig: Take 1-2 tablets (7.5-15 mg total) by mouth daily.    Dispense:  30 tablet    Refill:  0    There are no Patient Instructions on file for this visit.    Follow-up plan: Return in about 4 weeks (around 09/09/2019) for [place] +/- VISIT W/ DR T FOR SHOULDER/FINGER ISSUES .                                                 ################################################# ################################################# ################################################# #################################################    Current Meds  Medication Sig  . hydrochlorothiazide (HYDRODIURIL) 25 MG tablet TAKE 1 TABLET (25 MG TOTAL) BY MOUTH DAILY. **PATIENT NEEDS OFFICE VISIT FOR ADDITIONAL REFILLS**  . Melatonin 5 MG TABS Take 2.5-5 mg by mouth at bedtime as needed (sleep).     No Known Allergies         Visit summary with medication list and pertinent instructions was printed for patient to review, patient was advised to alert [place] if any updates are needed. All questions at time of visit were answered - patient instructed to contact office with any additional concerns. ER/RTC precautions were reviewed with the patient and understanding verbalized.  Please note: voice recognition software was used to produce this document, and typos may escape review. Please contact Dr. Sheppard Coil for any needed clarifications.    Follow up plan: Return in about 4 weeks (around 09/09/2019) for RECHECK MENTAL HEALTH +/- VISIT W/ DR T FOR SHOULDER/FINGER ISSUES .

## 2019-09-01 ENCOUNTER — Encounter: Payer: Self-pay | Admitting: Genetic Counselor

## 2019-09-09 ENCOUNTER — Other Ambulatory Visit: Payer: Self-pay | Admitting: Osteopathic Medicine

## 2019-09-23 ENCOUNTER — Other Ambulatory Visit: Payer: Self-pay

## 2019-09-23 ENCOUNTER — Inpatient Hospital Stay: Payer: 59 | Attending: Gynecologic Oncology | Admitting: Gynecologic Oncology

## 2019-09-23 ENCOUNTER — Encounter: Payer: Self-pay | Admitting: Gynecologic Oncology

## 2019-09-23 VITALS — BP 137/75 | HR 77 | Temp 97.9°F | Resp 18 | Ht 70.0 in | Wt 362.8 lb

## 2019-09-23 DIAGNOSIS — F329 Major depressive disorder, single episode, unspecified: Secondary | ICD-10-CM | POA: Diagnosis not present

## 2019-09-23 DIAGNOSIS — C541 Malignant neoplasm of endometrium: Secondary | ICD-10-CM | POA: Diagnosis not present

## 2019-09-23 DIAGNOSIS — Z90722 Acquired absence of ovaries, bilateral: Secondary | ICD-10-CM | POA: Insufficient documentation

## 2019-09-23 DIAGNOSIS — Z79899 Other long term (current) drug therapy: Secondary | ICD-10-CM | POA: Insufficient documentation

## 2019-09-23 DIAGNOSIS — Z90710 Acquired absence of both cervix and uterus: Secondary | ICD-10-CM | POA: Insufficient documentation

## 2019-09-23 DIAGNOSIS — Z87891 Personal history of nicotine dependence: Secondary | ICD-10-CM | POA: Insufficient documentation

## 2019-09-23 NOTE — Progress Notes (Signed)
Gynecologic Oncology Follow-up Note  Chief Complaint:  Chief Complaint  Patient presents with  . Endometrial adenocarcinoma [facility])    Assessment/Plan:  Ms. Rachel Herrera  is a 34 y.o.  year old with stage IA grade 1 endometrioid endometrial adenocarcinoma, s/p staging on 02/16/19. MSI stable/MMR normal.  Her pathology revealed low risk factors for recurrence, therefore no adjuvant therapy is recommended according to NCCN guidelines.  I discussed risk for recurrence and typical symptoms encouraged her to notify us of these should they develop between visits.  I recommend she have follow-up every 6 months for 5 years in accordance with NCCN guidelines. Those visits should include symptom assessment, physical exam and pelvic examination. Pap smears are not indicated or recommended in the routine surveillance of endometrial cancer. She will see Dr Benjie Karvonen in 6 months and myself in 12 months.   HPI: [name] is a 34 year old P0 who was seen in consultation at the request of Dr Benjie Karvonen for evaluation of FIGO grade 1 endometrial cancer.   The patient reported a longstanding history of menorrhagia with irregular heavy periods.  This culminated in admission to [facility] where she was diagnosed with severe anemia, required ICU admission and transfusion of 9 units of PRBC for what she thought was a Hb of 2.9.  She had not seen a gynecologist for approximately 10 years, but after this discharge was seen by Dr Benjie Karvonen in October, 2020. At that time a pap was performed on 11/23/18 which revealed atypical glandular cells.  An ultrasound was performed on 12/02/18 which showed a 5.5 x 3.9x 3.3cm fibroid along the posterior cervical edge extending into the endocervical canal. The fundal endometrium was also thickened. The ovaries were grossly normal.   She was taken to the OR on 12/16/18 for vaginal myomectomy.  Intraoperative findings were significant for a myoma delivering  through the cervix that was debulked to less than half its size but with a wide stalk could not facilitate complete excision.  She had minimal bleeding postoperatively.  Final pathology returned as a FIGO grade 1 endometrioid adenocarcinoma.  CT scan of the abdomen and pelvis was performed on December 24, 2018 which revealed mildly enlarged left iliac chain lymph node measuring 16 mm.  There was a mildly enlarged portacaval lymph node measuring 17 mm.  They were felt to be likely reactive.  The uterus was anteverted and grossly unremarkable.  The ovaries were also grossly unremarkable.  MRI pelvis was performed on 01/19/19 which showed a poorly marginated infiltrative appearing central uterine mass at the junction of the lower uterine cavity and uterine cervix, 3.0 x 2.1 x 2.9 cm, with invasion of the cervical fibrous stroma, which is near full-thickness and nodular appearing in the left uterine cervix. No frank parametrial or vaginal invasion. MRI staging at least FIGO stage II. Indeterminate bilateral external iliac and left common iliac lymphadenopathy, metastatic disease not excluded. Diffuse uterine adenomyosis. Normal ovaries.  No adnexal masses..  On 02/17/19 she underwent a robotic assisted modified radical hysterectomy, BSO, SLN biopsy. The left parametrium was taken due to concern for cervical stromal involvement on preoperative imaging. All nodes that were clinically suspicious were removed.   Final pathology revealed a 0.5cm FIGO grade 1 tumor with inner half myo invasion (8 of 27mm) with no LVSI and no involvement of the adnexa or cervix. All 4 lymph nodes were negative.  MSI stable/MMR normal.  She was determined to have low risk features and in accordance with  NCCN guidelines, no adjuvant therapy was recommended. We reviewed her pathology and discordant imaging at multidisciplinary conference with no change in the final staging (stage IA grade 1 endometrial cancer with low risk  factors).  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  Interval Hx:  She has no complaints at today's surveillance visit.   She received genetic counseling due to her young age, however, has not yet submitted the saliva kit.   Current Meds:  Outpatient Encounter Medications as of 09/23/2019  Medication Sig  . AMBULATORY NON FORMULARY MEDICATION Supply ordered: CPAP and other supplies needed (headgear, cushions, filters, heated tuubing and water chamber) Dx: obstructive sleep apnea Settings: auto-titration 5-20 cmH2O  . buPROPion (WELLBUTRIN XL) 150 MG 24 hr tablet Take 1 tablet (150 mg total) by mouth daily.  . Cholecalciferol (VITAMIN D) 125 MCG (5000 UT) CAPS Take 5,000 Units by mouth every other day.   . hydrochlorothiazide (HYDRODIURIL) 25 MG tablet TAKE 1 TABLET (25 MG TOTAL) BY MOUTH DAILY. **PATIENT NEEDS OFFICE VISIT FOR ADDITIONAL REFILLS**  . Melatonin 5 MG TABS Take 2.5-5 mg by mouth at bedtime as needed (sleep).   . meloxicam (MOBIC) 7.5 MG tablet Take 1-2 tablets (7.5-15 mg total) by mouth daily.  Marland Kitchen VIVELLE-DOT 0.1 MG/24HR patch PLACE 1 PATCH ONTO THE SKIN TWICE WEEKLY  . [DISCONTINUED] ferrous sulfate (FERROUSUL) 325 (65 FE) MG tablet Take 1 tablet (325 mg total) by mouth daily with breakfast. (Patient not taking: Reported on 08/12/2019)   No facility-administered encounter medications on file as of 09/23/2019.    Allergy: No Known Allergies  Social Hx:   Social History   Socioeconomic History  . Marital status: Married    Spouse name: Not on file  . Number of children: Not on file  . Years of education: Not on file  . Highest education level: Not on file  Occupational History  . Not on file  Tobacco Use  . Smoking status: Former Smoker    Quit date: 12/12/2015    Years since quitting: 3.7  . Smokeless tobacco: Never Used  Vaping Use  . Vaping Use: Every day  . Substances: Nicotine  Substance and Sexual  Activity  . Alcohol use: Yes    Comment: weekends  . Drug use: No  . Sexual activity: Yes    Birth control/protection: Condom  Other Topics Concern  . Not on file  Social History Narrative  . Not on file   Social Determinants of Health   Financial Resource Strain:   . Difficulty of Paying Living Expenses:   Food Insecurity:   . Worried About Charity fundraiser in the Last Year:   . Arboriculturist in the Last Year:   Transportation Needs:   . Film/video editor (Medical):   Marland Kitchen Lack of Transportation (Non-Medical):   Physical Activity:   . Days of Exercise per Week:   . Minutes of Exercise per Session:   Stress:   . Feeling of Stress :   Social Connections:   . Frequency of Communication with Friends and Family:   . Frequency of Social Gatherings with Friends and Family:   . Attends Religious Services:   . Active Member of Clubs or Organizations:   . Attends Archivist Meetings:   Marland Kitchen Marital Status:   Intimate Partner Violence:   . Fear of Current or Ex-Partner:   . Emotionally Abused:   Marland Kitchen Physically Abused:   .  Sexually Abused:     Past Surgical Hx:  Past Surgical History:  Procedure Laterality Date  . DILATATION & CURETTAGE/HYSTEROSCOPY WITH MYOSURE N/A 12/16/2018   Procedure: DILATATION & CURETTAGE, Vaginal Partial Myomectomy Prolapsing Cervical  Myoma;  Surgeon: Azucena Fallen, MD;  Location: [place];  Service: Gynecology;  Laterality: N/A;  . ROBOTIC ASSISTED TOTAL HYSTERECTOMY Bilateral 02/17/2019   Procedure: XI MODIFIED ROBOTIC ASSISTED TOTAL HYSTERECTOMY WITH BILATERAL SALPINGOOPHORECTOMY;  Surgeon: Everitt Amber, MD;  Location: [facility];  Service: Gynecology;  Laterality: Bilateral;  . SENTINEL NODE BIOPSY N/A 02/17/2019   Procedure: SENTINEL NODE BIOPSY;  Surgeon: Everitt Amber, MD;  Location: [facility];  Service: Gynecology;  Laterality: N/A;    Past Medical Hx:  Past Medical History:  Diagnosis Date  . Anemia    takes iron  . Cancer (HCC)     endometrial  . Depression   . Family history of brain cancer   . Family history of colon cancer   . Hypertension   . Sleep apnea    no cpap     Past Gynecological History:  See HPI No LMP recorded.  Family Hx:  Family History  Problem Relation Age of Onset  . Hyperlipidemia Maternal Aunt   . Cervical cancer Maternal Aunt 38  . Heart attack Maternal Uncle   . Brain cancer Maternal Uncle 65  . Heart attack Maternal Grandfather   . Lymphoma Cousin   . Polycythemia Maternal Grandmother   . Dementia Paternal Grandmother   . Lung cancer Maternal Uncle     Review of Systems:  Constitutional  Feels well,    ENT Normal appearing ears and nares bilaterally Skin/Breast  No rash, sores, jaundice, itching, dryness Cardiovascular  No chest pain, shortness of breath, or edema  Pulmonary  No cough or wheeze.  Gastro Intestinal  No nausea, vomitting, or diarrhoea. No bright red blood per rectum, no abdominal pain, change in bowel movement, or constipation.  Genito Urinary  No frequency, urgency, dysuria, + bleeding Musculo Skeletal  No myalgia, arthralgia, joint swelling or pain  Neurologic  No weakness, numbness, change in gait,  Psychology  No depression, anxiety, insomnia.   Vitals:  Blood pressure 137/75, pulse 77, temperature 97.9 F (36.6 C), temperature source Tympanic, resp. rate 18, height $RemoveBe'5\' 10"'[id number]$  (1.778 m), weight (!) 362 lb 12.8 oz (164.6 kg), SpO2 99 %.  Physical Exam: WD in NAD Neck  Supple NROM, without any enlargements.  Lymph Node Survey No cervical supraclavicular or inguinal adenopathy Cardiovascular  Pulse normal rate, regularity and rhythm. S1 and S2 normal.  Lungs  Clear to auscultation bilateraly, without wheezes/crackles/rhonchi. Good air movement.  Skin  No rash/lesions/breakdown  Psychiatry  Alert and oriented to person, place, and time  Abdomen  Normoactive bowel sounds, abdomen soft, non-tender and obese without evidence of hernia. Well  healed incisions Back No CVA tenderness Genito Urinary  Vulva/vagina: Normal external female genitalia.   No lesions. No discharge or bleeding.  Bladder/urethra:  No lesions or masses, well supported bladder  Vagina: normal vaginal cuff smooth without bleeding or lesions. Rectal  deferred Extremities  No bilateral cyanosis, clubbing or edema.  Thereasa Solo, MD  09/23/2019, 3:43 PM

## 2019-09-23 NOTE — Patient Instructions (Signed)
Please notify Dr Denman George at phone number [phone] if you notice vaginal bleeding, new pelvic or abdominal pains, bloating, feeling full easy, or a change in bladder or bowel function.   Please contact Dr Serita Grit office (at [phone]) in April, 2022 to request an appointment with her for August, 2022. Please see Dr Benjie Karvonen in February, 2022.

## 2019-09-24 ENCOUNTER — Other Ambulatory Visit: Payer: Self-pay | Admitting: Osteopathic Medicine

## 2019-11-24 ENCOUNTER — Other Ambulatory Visit: Payer: Self-pay

## 2019-11-24 MED ORDER — HYDROCHLOROTHIAZIDE 25 MG PO TABS: 25.0000 mg | ORAL_TABLET | Freq: Every day | ORAL | 0 refills | Status: DC

## 2020-07-21 ENCOUNTER — Other Ambulatory Visit: Payer: Self-pay

## 2020-07-21 ENCOUNTER — Ambulatory Visit: Payer: 59 | Admitting: Osteopathic Medicine

## 2020-07-21 VITALS — BP 155/78 | HR 84 | Temp 98.1°F | Wt 385.0 lb

## 2020-07-21 DIAGNOSIS — G4733 Obstructive sleep apnea (adult) (pediatric): Secondary | ICD-10-CM

## 2020-07-21 DIAGNOSIS — H9202 Otalgia, left ear: Secondary | ICD-10-CM | POA: Diagnosis not present

## 2020-07-21 DIAGNOSIS — I10 Essential (primary) hypertension: Secondary | ICD-10-CM | POA: Diagnosis not present

## 2020-07-21 DIAGNOSIS — H6983 Other specified disorders of Eustachian tube, bilateral: Secondary | ICD-10-CM

## 2020-07-21 MED ORDER — HYDROCHLOROTHIAZIDE 25 MG PO TABS: 25.0000 mg | ORAL_TABLET | Freq: Every day | ORAL | 0 refills | Status: DC

## 2020-07-21 MED ORDER — BUPROPION HCL ER (XL) 150 MG PO TB24: 150.0000 mg | ORAL_TABLET | Freq: Every day | ORAL | 0 refills | Status: DC

## 2020-07-21 NOTE — Progress Notes (Signed)
Rachel Herrera is a 35 y.o. female who presents to  [place] at [facility]  today, 07/21/20, seeking care for the following:  Ear fullness.pressure on L. On exam, TM and canals normal bilaterally, (+)effusion behind R TM and to lesser degree behind L TM. Reports diminished hearing from L, no headache, no vision change, (+)tinnitus, no vertigo  HTN, out of meds, no CP/SOB     ASSESSMENT & PLAN with other pertinent findings:  The primary encounter diagnosis was Dysfunction of both eustachian tubes. Diagnoses of Left ear pain, Essential hypertension, and Obstructive sleep apnea were also pertinent to this visit.   Treating now for eustachain tube dysfunction Refill HTN meds Due for labs  OK to refill meds 30 days and if labs/BP ok can refill 90 days x3 refills   There are no Patient Instructions on file for this visit.  Orders Placed This Encounter  Procedures   CBC   COMPLETE METABOLIC PANEL WITH GFR   Lipid panel    Meds ordered this encounter  Medications   hydrochlorothiazide (HYDRODIURIL) 25 MG tablet    Sig: Take 1 tablet (25 mg total) by mouth daily.    Dispense:  30 tablet    Refill:  0    NEED LABS DONE PRIOR TO AUTHORIZATION OF FURTHER  REFILLS   buPROPion (WELLBUTRIN XL) 150 MG 24 hr tablet    Sig: Take 1 tablet (150 mg total) by mouth daily.    Dispense:  90 tablet    Refill:  0    NEED LABS DONE PRIOR TO AUTHORIZATION OF FURTHER  REFILLS     See attached infor for Eustachain Tube Dysfunction  Sending some Rx to hopefully help this: 1. steroids to calm down inflammation (Prednisone) 2. nasal spray to openup the eustachain tube  OTC meds to take, too: 1. antihistamine + decongestant (Claritin-D, Allegra-D, Zyrtec-D, etc - ask pharmacist!)   Refilled BP Rx and Wellbutrin for 30 days Come see [place] for blood draw and BP recheck - is BP and labs ok, will refill for a year   See below for relevant physical  exam findings  See below for recent lab and imaging results reviewed  Medications, allergies, PMH, PSH, SocH, [place] reviewed below    Follow-up instructions: Return in about 2 weeks (around 08/04/2020) for 1) nurse visit BP check and 2) lab visit w/ phlebotomist .                                        Exam:  BP (!) 155/78 (BP Location: Left Arm, Patient Position: Sitting, Cuff Size: Normal)   Pulse 84   Temp 98.1 F (36.7 C) (Oral)   Wt (!) 385 lb 0.6 oz (174.7 kg)   BMI 55.25 kg/m  Constitutional: VS see above. General Appearance: alert, well-developed, well-nourished, NAD Neck: No masses, trachea midline.  HEENT: see above Respiratory: Normal respiratory effort. no wheeze, no rhonchi, no rales Cardiovascular: S1/S2 normal, no murmur, no rub/gallop auscultated. RRR.  Musculoskeletal: Gait normal. Symmetric and independent movement of all extremities Neurological: Normal balance/coordination. No tremor. Skin: warm, dry, intact.  Psychiatric: Normal judgment/insight. Normal mood and affect. Oriented x3.   Current Meds  Medication Sig   AMBULATORY NON FORMULARY MEDICATION Supply ordered: CPAP and other supplies needed (headgear, cushions, filters, heated tuubing and water chamber) Dx: obstructive sleep apnea Settings: auto-titration 5-20  cmH2O   Cholecalciferol (VITAMIN D) 125 MCG (5000 UT) CAPS Take 5,000 Units by mouth every other day.    hydrochlorothiazide (HYDRODIURIL) 25 MG tablet Take 1 tablet (25 mg total) by mouth daily.   Melatonin 5 MG TABS Take 2.5-5 mg by mouth at bedtime as needed (sleep).    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   Malignant neoplasm of uterus  ([place]) 12/30/2018   Cervical mass 12/30/2018   Endometrial adenocarcinoma ([place]) 12/30/2018   Obstructive sleep apnea 04/02/2016   Excessive daytime sleepiness 02/02/2016   PCOS (polycystic ovarian syndrome) 02/02/2016   Essential hypertension 02/02/2016   Morbid obesity ([place]) 02/02/2016   De Quervain's tenosynovitis, bilateral 02/02/2016    Family History  Problem Relation Age of Onset   Hyperlipidemia Maternal Aunt    Cervical cancer Maternal Aunt 38   Heart attack Maternal Uncle    Brain cancer Maternal Uncle 76   Heart attack Maternal Grandfather    Lymphoma Cousin    Polycythemia Maternal Grandmother    Dementia Paternal Grandmother    Lung cancer Maternal Uncle     Social History   Tobacco Use  Smoking Status Former   Pack years: 0.00   Types: Cigarettes   Quit date: 12/12/2015   Years since quitting: 4.6  Smokeless Tobacco Never    Past Surgical History:  Procedure Laterality Date   DILATATION & CURETTAGE/HYSTEROSCOPY WITH MYOSURE N/A 12/16/2018   Procedure: DILATATION & CURETTAGE, Vaginal Partial Myomectomy Prolapsing Cervical  Myoma;  Surgeon: Azucena Fallen, MD;  Location: [place];  Service: Gynecology;  Laterality: N/A;   ROBOTIC ASSISTED TOTAL HYSTERECTOMY Bilateral 02/17/2019   Procedure: XI MODIFIED ROBOTIC ASSISTED TOTAL HYSTERECTOMY WITH BILATERAL SALPINGOOPHORECTOMY;  Surgeon: Everitt Amber, MD;  Location[facility];  Service: Gynecology;  Laterality: Bilateral;   SENTINEL NODE BIOPSY N/A 02/17/2019   Procedure: SENTINEL NODE BIOPSY;  Surgeon: Everitt Amber, MD;  Location: [facility];  Service: Gynecology;  Laterality: N/A;    Immunization History  Administered Date(s) Administered   Influenza,inj,Quad PF,6+ Mos 11/10/2018   Moderna Sars-Covid-2 Vaccination 04/14/2019, 05/12/2019   Tdap 02/02/2016    No results found for this or any previous visit (from the past 2160 hour(s)).  No results found.     All questions at time of visit were answered - patient  instructed to contact office with any additional concerns or updates. ER/RTC precautions were reviewed with the patient as applicable.   Please note: manual typing as well as voice recognition software may have been used to produce this document - typos may escape review. Please contact Dr. Sheppard Coil for any needed clarifications.

## 2020-07-22 ENCOUNTER — Telehealth: Payer: Self-pay

## 2020-07-22 NOTE — Telephone Encounter (Signed)
Patient left a vm msg stating that provider did not send in the prednisone and the nasal spray rxs to the pharmacy. Please send rxs to the pharmacy.

## 2020-07-25 MED ORDER — PREDNISONE 20 MG PO TABS: 20.0000 mg | ORAL_TABLET | Freq: Two times a day (BID) | ORAL | 0 refills | Status: DC

## 2020-07-25 MED ORDER — IPRATROPIUM BROMIDE 0.06 % NA SOLN: 2.0000 | Freq: Four times a day (QID) | NASAL | 1 refills | Status: DC

## 2020-08-10 ENCOUNTER — Ambulatory Visit (INDEPENDENT_AMBULATORY_CARE_PROVIDER_SITE_OTHER): Payer: 59 | Admitting: Osteopathic Medicine

## 2020-08-10 ENCOUNTER — Other Ambulatory Visit: Payer: 59

## 2020-08-10 ENCOUNTER — Other Ambulatory Visit: Payer: Self-pay

## 2020-08-10 ENCOUNTER — Encounter: Payer: Self-pay | Admitting: Osteopathic Medicine

## 2020-08-10 VITALS — BP 158/88 | HR 67

## 2020-08-10 DIAGNOSIS — I10 Essential (primary) hypertension: Secondary | ICD-10-CM | POA: Diagnosis not present

## 2020-08-10 MED ORDER — VALSARTAN-HYDROCHLOROTHIAZIDE 80-12.5 MG PO TABS
1.0000 | ORAL_TABLET | Freq: Every day | ORAL | 0 refills | Status: DC
Start: 2020-08-10 — End: 2020-09-02

## 2020-08-10 NOTE — Progress Notes (Signed)
Established Patient Office Visit  Subjective:  Patient ID: Rachel Herrera, female    DOB: [birth year]  Age: 35 y.o. MRN: [id number]  CC:  Chief Complaint  Patient presents with   Hypertension    HPI Rachel Herrera presents for blood pressure check. Denies chest pain, shortness of breath or dizziness.   Past Medical History:  Diagnosis Date   Anemia    takes iron   Cancer ([place])    endometrial   Depression    Family history of brain cancer    Family history of colon cancer    Hypertension    Sleep apnea    no cpap     Past Surgical History:  Procedure Laterality Date   DILATATION & CURETTAGE/HYSTEROSCOPY WITH MYOSURE N/A 12/16/2018   Procedure: DILATATION & CURETTAGE, Vaginal Partial Myomectomy Prolapsing Cervical  Myoma;  Surgeon: Azucena Fallen, MD;  Location: [place];  Service: Gynecology;  Laterality: N/A;   ROBOTIC ASSISTED TOTAL HYSTERECTOMY Bilateral 02/17/2019   Procedure: XI MODIFIED ROBOTIC ASSISTED TOTAL HYSTERECTOMY WITH BILATERAL SALPINGOOPHORECTOMY;  Surgeon: Everitt Amber, MD;  Location[facility];  Service: Gynecology;  Laterality: Bilateral;   SENTINEL NODE BIOPSY N/A 02/17/2019   Procedure: SENTINEL NODE BIOPSY;  Surgeon: Everitt Amber, MD;  Location: [facility];  Service: Gynecology;  Laterality: N/A;    Family History  Problem Relation Age of Onset   Hyperlipidemia Maternal Aunt    Cervical cancer Maternal Aunt 38   Heart attack Maternal Uncle    Brain cancer Maternal Uncle 66   Heart attack Maternal Grandfather    Lymphoma Cousin    Polycythemia Maternal Grandmother    Dementia Paternal Grandmother    Lung cancer Maternal Uncle     Social History   Socioeconomic History   Marital status: Married    Spouse name: Not on file   Number of children: Not on file   Years of education: Not on file   Highest education level: Not on file  Occupational History   Not on file  Tobacco Use   Smoking status: Former    Pack years: 0.00    Types:  Cigarettes    Quit date: 12/12/2015    Years since quitting: 4.6   Smokeless tobacco: Never  Vaping Use   Vaping Use: Every day   Substances: Nicotine  Substance and Sexual Activity   Alcohol use: Yes    Comment: weekends   Drug use: No   Sexual activity: Yes    Birth control/protection: Condom  Other Topics Concern   Not on file  Social History Narrative   Not on file   Social Determinants of Health   Financial Resource Strain: Not on file  Food Insecurity: Not on file  Transportation Needs: Not on file  Physical Activity: Not on file  Stress: Not on file  Social Connections: Not on file  Intimate Partner Violence: Not on file    Outpatient Medications Prior to Visit  Medication Sig Dispense Refill   AMBULATORY NON FORMULARY MEDICATION Supply ordered: CPAP and other supplies needed (headgear, cushions, filters, heated tuubing and water chamber) Dx: obstructive sleep apnea Settings: auto-titration 5-20 cmH2O 1 Units prn   buPROPion (WELLBUTRIN XL) 150 MG 24 hr tablet Take 1 tablet (150 mg total) by mouth daily. 90 tablet 0   Cholecalciferol (VITAMIN D) 125 MCG (5000 UT) CAPS Take 5,000 Units by mouth every other day.      hydrochlorothiazide (HYDRODIURIL) 25 MG tablet Take 1 tablet (25 mg total) by  mouth daily. 30 tablet 0   ipratropium (ATROVENT) 0.06 % nasal spray Place 2 sprays into both nostrils 4 (four) times daily. As needed for runny nose / postnasal drip 15 mL 1   Melatonin 5 MG TABS Take 2.5-5 mg by mouth at bedtime as needed (sleep).      meloxicam (MOBIC) 7.5 MG tablet Take 1-2 tablets (7.5-15 mg total) by mouth daily. 30 tablet 0   VIVELLE-DOT 0.1 MG/24HR patch PLACE 1 PATCH ONTO THE SKIN TWICE WEEKLY 24 patch 5   predniSONE (DELTASONE) 20 MG tablet Take 1 tablet (20 mg total) by mouth 2 (two) times daily with a meal. 10 tablet 0   No facility-administered medications prior to visit.    No Known Allergies  ROS Review of Systems    Objective:     Physical Exam  BP (!) 158/88   Pulse 67   SpO2 97%  Wt Readings from Last 3 Encounters:  07/21/20 (!) 385 lb 0.6 oz (174.7 kg)  09/23/19 (!) 362 lb 12.8 oz (164.6 kg)  08/12/19 (!) 356 lb (161.5 kg)     Health Maintenance Due  Topic Date Due   Pneumococcal Vaccine 56-35 Years old (1 - PCV) Never done   HIV Screening  Never done   Hepatitis C Screening  Never done   COVID-19 Vaccine (3 - Moderna risk series) 06/09/2019    There are no preventive care reminders to display for this patient.  No results found for: TSH Lab Results  Component Value Date   WBC 5.6 02/13/2019   HGB 13.6 02/13/2019   HCT 44.5 02/13/2019   MCV 86.1 02/13/2019   PLT 211 02/13/2019   Lab Results  Component Value Date   NA 136 02/13/2019   K 4.2 02/13/2019   CO2 28 02/13/2019   GLUCOSE 95 02/13/2019   BUN 12 02/13/2019   CREATININE 0.69 02/13/2019   BILITOT 1.3 (H) 02/13/2019   ALKPHOS 91 02/13/2019   AST 50 (H) 02/13/2019   ALT 65 (H) 02/13/2019   PROT 7.6 02/13/2019   ALBUMIN 3.9 02/13/2019   CALCIUM 9.9 02/13/2019   ANIONGAP 10 02/13/2019   No results found for: CHOL No results found for: HDL No results found for: LDLCALC No results found for: TRIG No results found for: CHOLHDL No results found for: HGBA1C    Assessment & Plan:  Hypertension - Per Dr Sheppard Coil patient advised to stop the HCTZ and start a combination of Losartan-HCTZ. Also advised to follow up in 2 weeks for a nurse visit blood pressure check.   Problem List Items Addressed This Visit     Essential hypertension - Primary    No orders of the defined types were placed in this encounter.   Follow-up: Return in about 2 weeks (around 08/24/2020) for nurse visit blood pressure check. Durene Romans, Monico Blitz, [place]

## 2020-08-11 LAB — LIPID PANEL
Cholesterol: 196 mg/dL (ref <200)
HDL: 42 mg/dL — ABNORMAL LOW (ref >=50)
LDL Cholesterol (Calc): 127 mg/dL (calc) — ABNORMAL HIGH
Non-HDL Cholesterol (Calc): 154 mg/dL (calc) — ABNORMAL HIGH (ref <130)
Total CHOL/HDL Ratio: 4.7 (calc) (ref <5.0)
Triglycerides: 153 mg/dL — ABNORMAL HIGH (ref <150)

## 2020-08-11 LAB — COMPLETE METABOLIC PANEL WITH GFR
AG Ratio: 1.3 (calc) (ref 1.0–2.5)
ALT: 37 U/L — ABNORMAL HIGH (ref 6–29)
AST: 22 U/L (ref 10–30)
Albumin: 4.1 g/dL (ref 3.6–5.1)
Alkaline phosphatase (APISO): 64 U/L (ref 31–125)
BUN: 15 mg/dL (ref 7–25)
CO2: 29 mmol/L (ref 20–32)
Calcium: 9.6 mg/dL (ref 8.6–10.2)
Chloride: 99 mmol/L (ref 98–110)
Creat: 0.73 mg/dL (ref 0.50–1.10)
GFR, Est African American: 125 mL/min/{1.73_m2} (ref >=60)
GFR, Est Non African American: 107 mL/min/{1.73_m2} (ref >=60)
Globulin: 3.1 g/dL (calc) (ref 1.9–3.7)
Glucose, Bld: 96 mg/dL (ref 65–99)
Potassium: 4.3 mmol/L (ref 3.5–5.3)
Sodium: 137 mmol/L (ref 135–146)
Total Bilirubin: 2.9 mg/dL — ABNORMAL HIGH (ref 0.2–1.2)
Total Protein: 7.2 g/dL (ref 6.1–8.1)

## 2020-08-11 LAB — CBC
HCT: 48.5 % — ABNORMAL HIGH (ref 35.0–45.0)
Hemoglobin: 17 g/dL — ABNORMAL HIGH (ref 11.7–15.5)
MCH: 33.9 pg — ABNORMAL HIGH (ref 27.0–33.0)
MCHC: 35.1 g/dL (ref 32.0–36.0)
MCV: 96.8 fL (ref 80.0–100.0)
MPV: 10.9 fL (ref 7.5–12.5)
Platelets: 213 10*3/uL (ref 140–400)
RBC: 5.01 10*6/uL (ref 3.80–5.10)
RDW: 13 % (ref 11.0–15.0)
WBC: 7.5 10*3/uL (ref 3.8–10.8)

## 2020-08-19 ENCOUNTER — Other Ambulatory Visit: Payer: Self-pay | Admitting: Osteopathic Medicine

## 2020-08-23 ENCOUNTER — Ambulatory Visit (INDEPENDENT_AMBULATORY_CARE_PROVIDER_SITE_OTHER): Payer: 59 | Admitting: Medical-Surgical

## 2020-08-23 ENCOUNTER — Other Ambulatory Visit: Payer: Self-pay

## 2020-08-23 VITALS — BP 130/83 | HR 90 | Ht 70.0 in | Wt 389.0 lb

## 2020-08-23 DIAGNOSIS — I10 Essential (primary) hypertension: Secondary | ICD-10-CM

## 2020-08-23 NOTE — Progress Notes (Signed)
Patient is here for blood pressure check.   Previous BP was 158/88  1st BP today: 130/83  Pt states started taking meds x 2 weeks. Denies trouble sleeping or palpitations. Notes intermittent dizziness. Advised on changing time of taking medication. Patient to call if dizziness continues and is not improving.   Taking medication as prescribed. Missed a few doses.

## 2020-09-01 ENCOUNTER — Other Ambulatory Visit: Payer: Self-pay | Admitting: Osteopathic Medicine

## 2020-09-05 HISTORY — PX: CHOLECYSTECTOMY: SHX55

## 2020-09-22 ENCOUNTER — Other Ambulatory Visit: Payer: Self-pay

## 2020-09-22 ENCOUNTER — Emergency Department
Admission: EM | Admit: 2020-09-22 | Discharge: 2020-09-22 | Disposition: A | Discharge status: Home / Self Care | Payer: 59 | Source: Home / Self Care

## 2020-09-22 DIAGNOSIS — R1010 Upper abdominal pain, unspecified: Secondary | ICD-10-CM | POA: Diagnosis not present

## 2020-09-22 DIAGNOSIS — R1013 Epigastric pain: Secondary | ICD-10-CM

## 2020-09-22 NOTE — ED Notes (Signed)
Patient is being discharged from the Urgent Care and sent to the Emergency Department via POV . Per Eliezer Lofts NP, patient is in need of higher level of care due to epigastric pain. Patient is aware and verbalizes understanding of plan of care.  Vitals:   09/22/20 1125  BP: (!) 157/84  Pulse: (!) 50  Resp: 16  Temp: 97.8 F (36.6 C)  SpO2: 97%

## 2020-09-22 NOTE — ED Provider Notes (Signed)
R[facility] Her[facility]    CSN: [id number] Arrival date & time: 09/22/20  1110      History   Chief Complaint Chief Complaint  Patient presents with   Abdominal Pain    HPI Rachel Herrera is a 35 y.o. female.   HPI 35 year old female presents with epigastric pain for 4 days after eating patient endorses vomiting to attempt to relieve pressure.  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  Patient is concerned for possible pancreatitis.  PMH significant for morbid obesity, malignant neoplasm of uterus, cervical mass, endometrial adenocarcinoma, and secondary malignant neoplasm of cervix.  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   Malignant neoplasm of uterus ([place]) 12/30/2018   Cervical mass 12/30/2018   Endometrial adenocarcinoma ([place]) 12/30/2018   Obstructive sleep apnea 04/02/2016   Excessive daytime sleepiness 02/02/2016   PCOS (polycystic ovarian syndrome) 02/02/2016   Essential hypertension 02/02/2016   Morbid obesity ([place]) 02/02/2016   De Quervain's tenosynovitis, bilateral 02/02/2016    Past Surgical History:  Procedure Laterality Date   DILATATION & CURETTAGE/HYSTEROSCOPY WITH MYOSURE N/A 12/16/2018   Procedure: DILATATION & CURETTAGE, Vaginal Partial Myomectomy Prolapsing Cervical  Myoma;  Surgeon: Azucena Fallen, MD;  Location: [place];  Service: Gynecology;  Laterality: N/A;   ROBOTIC ASSISTED TOTAL HYSTERECTOMY Bilateral 02/17/2019   Procedure: XI MODIFIED ROBOTIC  ASSISTED TOTAL HYSTERECTOMY WITH BILATERAL SALPINGOOPHORECTOMY;  Surgeon: Everitt Amber, MD;  [facility];  Service: Gynecology;  Laterality: Bilateral;   SENTINEL NODE BIOPSY N/A 02/17/2019   Procedure: SENTINEL NODE BIOPSY;  Surgeon: Everitt Amber, MD;  [facility];  Service: Gynecology;  Laterality: N/A;    OB History   No obstetric history on file.      Home Medications    Prior to Admission medications   Medication Sig Start Date End Date Taking? Authorizing Provider  AMBULATORY NON FORMULARY MEDICATION Supply ordered: CPAP and other supplies needed (headgear, cushions, filters, heated tuubing and water chamber) Dx: obstructive sleep apnea Settings: auto-titration 5-20 cmH2O 11/11/18   Emeterio Reeve, DO  buPROPion (WELLBUTRIN XL) 150 MG 24 hr tablet Take 1 tablet (150 mg total) by mouth daily. 07/21/20   Emeterio Reeve, DO  Cholecalciferol (VITAMIN D) 125 MCG (5000 UT) CAPS Take 5,000 Units by mouth every other day.  Patient not taking: Reported on 09/22/2020    [provider]  ipratropium (ATROVENT) 0.06 % nasal spray Place 2 sprays into both nostrils 4 (four) times daily. As needed for runny nose / postnasal drip Patient not taking: Reported on 09/22/2020 07/25/20   Emeterio Reeve, DO  Melatonin 5 MG TABS Take 2.5-5 mg by mouth at bedtime as needed (sleep).     [provider]  meloxicam (MOBIC) 7.5 MG tablet Take 1-2 tablets (7.5-15 mg total) by mouth daily. Patient not taking: Reported on 09/22/2020 08/12/19   Emeterio Reeve, DO  valsartan-hydrochlorothiazide (DIOVAN-HCT) 80-12.5 MG  tablet TAKE 1 TABLET BY MOUTH EVERY DAY 09/02/20   Emeterio Reeve, DO  VIVELLE-DOT 0.1 MG/24HR patch PLACE 1 PATCH ONTO THE SKIN TWICE WEEKLY Patient not taking: Reported on 09/22/2020 06/09/19   Everitt Amber, MD    Family History Family History  Problem Relation Age of Onset   Hyperlipidemia Maternal Aunt    Cervical cancer Maternal Aunt 38   Heart attack  Maternal Uncle    Brain cancer Maternal Uncle 65   Heart attack Maternal Grandfather    Lymphoma Cousin    Polycythemia Maternal Grandmother    Dementia Paternal Grandmother    Lung cancer Maternal Uncle     Social History Social History   Tobacco Use   Smoking status: Former    Types: Cigarettes    Quit date: 12/12/2015    Years since quitting: 4.7   Smokeless tobacco: Never  Vaping Use   Vaping Use: Every day   Substances: Nicotine  Substance Use Topics   Alcohol use: Yes    Comment: weekends   Drug use: No     Allergies   Patient has no known allergies.   Review of Systems Review of Systems  Gastrointestinal:  Positive for abdominal pain.       Epigastric/upper abdominal pain worsening after 4 days  All other systems reviewed and are negative.   Physical Exam Triage Vital Signs ED Triage Vitals  Enc Vitals Group     BP 09/22/20 1125 (!) 157/84     Pulse Rate 09/22/20 1125 (!) 50     Resp 09/22/20 1125 16     Temp 09/22/20 1125 97.8 F (36.6 C)     Temp Source 09/22/20 1125 Oral     SpO2 09/22/20 1125 97 %     Weight 09/22/20 1128 (!) 385 lb (174.6 kg)     Height 09/22/20 1128 '5\' 10"'$  (1.778 m)     Head Circumference --      Peak Flow --      Pain Score 09/22/20 1128 6     Pain Loc --      Pain Edu? --      Excl. in [place]? --    No data found.  Updated Vital Signs BP (!) 157/84 (BP Location: Left Arm)   Pulse (!) 50 Comment: M Rachel Sequeira np notified  Temp 97.8 F (36.6 C) (Oral)   Resp 16   Ht '5\' 10"'$  (1.778 m)   Wt (!) 385 lb (174.6 kg)   SpO2 97%   BMI 55.24 kg/m      Physical Exam Vitals and nursing note reviewed.  Constitutional:      General: She is not in acute distress.    Appearance: She is obese. She is ill-appearing.  HENT:     Head: Normocephalic and atraumatic.     Mouth/Throat:     Mouth: Mucous membranes are moist.     Pharynx: Oropharynx is clear.  Eyes:     Extraocular Movements: Extraocular movements intact.      Conjunctiva/sclera: Conjunctivae normal.     Pupils: Pupils are equal, round, and reactive to light.  Cardiovascular:     Rate and Rhythm: Bradycardia present.     Pulses: Normal pulses.     Heart sounds: Normal heart sounds. No murmur heard.   No friction rub. No gallop.  Pulmonary:     Effort: Pulmonary effort is normal.     Breath sounds: Normal breath sounds. No wheezing, rhonchi or rales.  Abdominal:  General: There is distension.     Palpations: Abdomen is soft. There is no mass.     Tenderness: There is abdominal tenderness. There is no right CVA tenderness, left CVA tenderness, guarding or rebound.     Comments: Hypoactive bowel sounds x4, TTP over inferior epigastric/LUQ/RUQ, no hepatosplenomegaly  Musculoskeletal:        General: Normal range of motion.     Cervical back: Normal range of motion and neck supple. No rigidity or tenderness.  Lymphadenopathy:     Cervical: No cervical adenopathy.  Skin:    General: Skin is warm and dry.  Neurological:     General: No focal deficit present.     Mental Status: She is alert and oriented to person, place, and time. Mental status is at baseline.  Psychiatric:        Mood and Affect: Mood normal.        Behavior: Behavior normal.        Thought Content: Thought content normal.     UC Treatments / Results  Labs (all labs ordered are listed, but only abnormal results are displayed) Labs Reviewed - No data to display  EKG   Radiology No results found.  Procedures Procedures (including critical care time)  Medications Ordered in UC Medications - No data to display  Initial Impression / Assessment and Plan / UC Course  I have reviewed the triage vital signs and the nursing notes.  Pertinent labs & imaging results that were available during my care of the patient were reviewed by me and considered in my medical decision making (see chart for details).     MDM: 1.  Pain of upper abdomen; 2.  Abdominal pain,  epigastric-Advised/instructed patient to go to [facility] now for further evaluation of upper abdominal pain.  Patient agreed and verbalized understanding of these instructions and this plan of care today her father is outside and will drive her to ED now.  I have personally spoken to patient's father prior to her discharge this afternoon.  Patient discharged, hemodynamically stable. Final Clinical Impressions(s) / UC Diagnoses   Final diagnoses:  Pain of upper abdomen  Abdominal pain, epigastric     Discharge Instructions      Advised/instructed patient to go to [facility] now for further evaluation of upper abdominal pain.  Patient agreed and verbalized understanding of these instructions and this plan of care today her father is outside and will drive her to ED now.  I have personally spoken to patient's father prior to her discharge this afternoon.     ED Prescriptions   None    PDMP not reviewed this encounter.   Eliezer Lofts, [place] 09/22/20 1152

## 2020-09-22 NOTE — Discharge Instructions (Addendum)
Advised/instructed patient to go to [facility] now for further evaluation of upper abdominal pain.  Patient agreed and verbalized understanding of these instructions and this plan of care today her father is outside and will drive her to ED now.  I have personally spoken to patient's father prior to her discharge this afternoon.

## 2020-09-22 NOTE — ED Triage Notes (Signed)
Pt seen in UC w/ c/o epigastric pain x 4days after eating. Pt endorses vomiting to attempt to relieve pressure. Pt endorses taking two tylenol PTA

## 2020-09-27 ENCOUNTER — Telehealth: Payer: Self-pay

## 2020-09-27 NOTE — Telephone Encounter (Signed)
Transition Care Management Follow-up Telephone Call Date of discharge and from where: 09/26/2020 from [facility] How have you been since you were released from the hospital? Pt stated that she is feeling well, getting plenty of rest. Pt did not have any questions regarding her discharge instructions.  Any questions or concerns? No  Items Reviewed: Did the pt receive and understand the discharge instructions provided? Yes  Medications obtained and verified? Yes  Other? No  Any new allergies since your discharge? No  Dietary orders reviewed? No Do you have support at home? Yes   Functional Questionnaire: (I = Independent and D = Dependent) ADLs: I  Bathing/Dressing- I  Meal Prep- I  Eating- I  Maintaining continence- I  Transferring/Ambulation- I  Managing Meds- I   Follow up appointments reviewed:  PCP Hospital f/u appt confirmed? No   Specialist Hospital f/u appt confirmed? Yes  Scheduled to see Vanetta Shawl, PA on 10/11/2020 @ 1:45pm. Are transportation arrangements needed? No  If their condition worsens, is the pt aware to call PCP or go to the Emergency Dept.? Yes Was the patient provided with contact information for the PCP's office or ED? Yes Was to pt encouraged to call back with questions or concerns? Yes

## 2020-10-23 ENCOUNTER — Other Ambulatory Visit: Payer: Self-pay | Admitting: Osteopathic Medicine

## 2021-03-02 ENCOUNTER — Telehealth: Payer: Self-pay

## 2021-03-02 NOTE — Telephone Encounter (Signed)
Spoke with Rachel Herrera this morning. A refill request for Vivelle-Dot 0.1mg  patch was received from [facility] via fax. Prescription refill authorized per Joylene John, NP.   Patient is overdue for her follow up appointment. Appointment scheduled with Dr. Delsa Sale on 03/16/21 at 09:30 am. Patient is in agreement of appointment. Instructed to call with any needs.

## 2021-03-07 ENCOUNTER — Encounter: Payer: Self-pay | Admitting: Obstetrics & Gynecology

## 2021-03-16 ENCOUNTER — Inpatient Hospital Stay: Payer: 59 | Attending: Obstetrics & Gynecology | Admitting: Obstetrics & Gynecology

## 2021-03-16 ENCOUNTER — Encounter: Payer: Self-pay | Admitting: Obstetrics & Gynecology

## 2021-03-16 ENCOUNTER — Other Ambulatory Visit: Payer: Self-pay

## 2021-03-16 ENCOUNTER — Inpatient Hospital Stay: Payer: 59

## 2021-03-16 VITALS — BP 167/76 | HR 75 | Temp 97.8°F | Resp 18 | Ht 69.29 in | Wt 391.6 lb

## 2021-03-16 DIAGNOSIS — N939 Abnormal uterine and vaginal bleeding, unspecified: Secondary | ICD-10-CM | POA: Diagnosis not present

## 2021-03-16 DIAGNOSIS — C541 Malignant neoplasm of endometrium: Secondary | ICD-10-CM

## 2021-03-16 DIAGNOSIS — Z8542 Personal history of malignant neoplasm of other parts of uterus: Secondary | ICD-10-CM | POA: Insufficient documentation

## 2021-03-16 LAB — BASIC METABOLIC PANEL
Anion gap: 6 (ref 5–15)
BUN: 9 mg/dL (ref 6–20)
CO2: 32 mmol/L (ref 22–32)
Calcium: 9.6 mg/dL (ref 8.9–10.3)
Chloride: 101 mmol/L (ref 98–111)
Creatinine, Ser: 0.78 mg/dL (ref 0.44–1.00)
GFR, Estimated: >60 mL/min (ref >60)
Glucose, Bld: 98 mg/dL (ref 70–99)
Potassium: 4 mmol/L (ref 3.5–5.1)
Sodium: 139 mmol/L (ref 135–145)

## 2021-03-16 NOTE — Progress Notes (Signed)
Follow Up Note: Gyn-Onc  Rachel Herrera 36 y.o. female  CC: She returns for a f/u visit   HPI: The oncology history was reviewed.  Interval History: She denies abdominal/pelvic pain, cough, lethargy or increasing abdominal girth.  She had an episode of vaginal spotting several weeks ago.  Felt it was 2/2 rupture of a vulva cyst/boil.     Review of Systems  Review of Systems  Constitutional:  Negative for malaise/fatigue and weight loss.  Respiratory:  Negative for shortness of breath and wheezing.   Cardiovascular:  Negative for chest pain and leg swelling.  Gastrointestinal:  Negative for abdominal pain, blood in stool, constipation, nausea and vomiting.  Genitourinary:  Negative for dysuria, frequency, hematuria and urgency.  Musculoskeletal:  Negative for joint pain and myalgias.  Neurological:  Negative for weakness.  Psychiatric/Behavioral:  Negative for depression. The patient does not have insomnia.    Current medications, allergy, social history, past surgical history, past medical history, family history were all reviewed.    Vitals:  Blood pressure (!) 167/76, pulse 75, temperature 97.8 F (36.6 C), temperature source Oral, resp. rate 18, height 5' 9.29" (1.76 m), weight (!) 391 lb 9.6 oz (177.6 kg), SpO2 96 %.  Physical Exam:  Physical Exam Exam conducted with a chaperone present.  Constitutional:      General: She is not in acute distress. Cardiovascular:     Rate and Rhythm: Normal rate and regular rhythm.  Pulmonary:     Effort: Pulmonary effort is normal.     Breath sounds: Normal breath sounds. No wheezing or rhonchi.  Abdominal:     Palpations: Abdomen is soft.     Tenderness: There is no abdominal tenderness. There is no right CVA tenderness or left CVA tenderness.     Hernia: No hernia is present.  Genitourinary:    General: Normal vulva.     Urethra: No urethral lesion.     Vagina: No lesions seen.  Right, lower 1/3, side wall cyst, < 5 mm  palpated.  Small heme on exam glove Musculoskeletal:     Cervical back: Neck supple.     Right lower leg: No edema.     Left lower leg: No edema.  Lymphadenopathy:     Upper Body:     Right upper body: No supraclavicular adenopathy.     Left upper body: No supraclavicular adenopathy.     Lower Body: No right inguinal adenopathy. No left inguinal adenopathy.  Skin:    Findings: No rash.  Neurological:     Mental Status: She is oriented to person, place, and time.   Assessment/Plan:  Endometrial adenocarcinoma [facility]) Ms. Rachel Herrera  is a 36 y.o.  year old with stage IA grade 1 endometrioid endometrial adenocarcinoma, s/p staging on 02/16/19. MSI stable/MMR normal.  New onset vaginal bleeding; no concomitant sxs  Orders Placed This Encounter  Procedures   CT Abdomen Pelvis W Contrast    Standing Status:   Future    Standing Expiration Date:   03/16/2022    Order Specific Question:   If indicated for the ordered procedure, I authorize the administration of contrast media per Radiology protocol    Answer:   Yes    Order Specific Question:   Is patient pregnant?    Answer:   No    Order Specific Question:   Preferred imaging location?    Answer:   [facility]    Order Specific Question:   Is Oral Contrast  requested for this exam?    Answer:   Yes, Per Radiology protocol   CT ABDOMEN PELVIS W CONTRAST    This exam should ONLY be ordered for initial diagnosis or follow up of known pancreatic/liver/renal/bladder masses.    Standing Status:   Future    Standing Expiration Date:   03/16/2022    Order Specific Question:   If indicated for the ordered procedure, I authorize the administration of contrast media per Radiology protocol    Answer:   Yes    Order Specific Question:   Is patient pregnant?    Answer:   No    Order Specific Question:   Preferred imaging location?    Answer:   [facility]    Order Specific Question:   Is Oral Contrast requested for this  exam?    Answer:   Yes, Per Radiology protocol   Basic metabolic panel    Standing Status:   Future    Number of Occurrences:   1    Standing Expiration Date:   03/16/2022    >review the CT findings >return prn    Lahoma Crocker, MD

## 2021-03-16 NOTE — Assessment & Plan Note (Addendum)
Ms. ANEKA FAGERSTROM  is a 36 y.o.  year old with stage IA grade 1 endometrioid endometrial adenocarcinoma, s/p staging on 02/16/19. MSI stable/MMR normal. New onset vaginal bleeding; no concomitant sxs  Orders Placed This Encounter  Procedures   CT Abdomen Pelvis W Contrast    Standing Status:   Future    Standing Expiration Date:   03/16/2022    Order Specific Question:   If indicated for the ordered procedure, I authorize the administration of contrast media per Radiology protocol    Answer:   Yes    Order Specific Question:   Is patient pregnant?    Answer:   No    Order Specific Question:   Preferred imaging location?    Answer:   [facility]    Order Specific Question:   Is Oral Contrast requested for this exam?    Answer:   Yes, Per Radiology protocol   CT ABDOMEN PELVIS W CONTRAST    This exam should ONLY be ordered for initial diagnosis or follow up of known pancreatic/liver/renal/bladder masses.    Standing Status:   Future    Standing Expiration Date:   03/16/2022    Order Specific Question:   If indicated for the ordered procedure, I authorize the administration of contrast media per Radiology protocol    Answer:   Yes    Order Specific Question:   Is patient pregnant?    Answer:   No    Order Specific Question:   Preferred imaging location?    Answer:   [facility]    Order Specific Question:   Is Oral Contrast requested for this exam?    Answer:   Yes, Per Radiology protocol   Basic metabolic panel    Standing Status:   Future    Number of Occurrences:   1    Standing Expiration Date:   03/16/2022   >review the CT findings >return prn

## 2021-03-16 NOTE — Patient Instructions (Signed)
Return prn 

## 2021-03-24 ENCOUNTER — Ambulatory Visit (HOSPITAL_COMMUNITY)
Admission: RE | Admit: 2021-03-24 | Discharge: 2021-03-24 | Disposition: A | Discharge status: Home / Self Care | Payer: 59 | Source: Ambulatory Visit | Attending: Obstetrics & Gynecology | Admitting: Obstetrics & Gynecology

## 2021-03-24 ENCOUNTER — Other Ambulatory Visit: Payer: Self-pay

## 2021-03-24 DIAGNOSIS — C541 Malignant neoplasm of endometrium: Secondary | ICD-10-CM

## 2021-03-24 MED ORDER — IOHEXOL 300 MG/ML  SOLN
100.0000 mL | Freq: Once | INTRAMUSCULAR | Status: AC | PRN
  Administered 2021-03-24: 100 mL via INTRAVENOUS

## 2021-03-24 MED ORDER — SODIUM CHLORIDE (PF) 0.9 % IJ SOLN
INTRAMUSCULAR | Status: AC
  Filled 2021-03-24: qty 50

## 2021-05-26 ENCOUNTER — Telehealth: Payer: Self-pay | Admitting: *Deleted

## 2021-05-26 NOTE — Telephone Encounter (Signed)
CT results faxed to Emeterio Reeve pt's PCP.  ?

## 2021-05-26 NOTE — Telephone Encounter (Signed)
Attempted to reach pt regarding her CT scan results. Unable to reach her. LVM for return call.  ?

## 2021-05-29 NOTE — Telephone Encounter (Signed)
Spoke with pt this morning to inform her that per Joylene John, NP there's no suspicious findings on the CT scan. It is however showing liver and spleen are enlarged and she should follow up with her PCP for this. Result has been faxed to PCP office. Pt verbalized understanding.  ?

## 2021-06-02 ENCOUNTER — Other Ambulatory Visit: Payer: Self-pay | Admitting: Osteopathic Medicine

## 2021-06-30 ENCOUNTER — Other Ambulatory Visit: Payer: Self-pay | Admitting: Family Medicine

## 2021-07-14 ENCOUNTER — Other Ambulatory Visit: Payer: Self-pay | Admitting: Family Medicine

## 2021-07-31 ENCOUNTER — Encounter: Payer: Self-pay | Admitting: Sports Medicine

## 2021-07-31 ENCOUNTER — Ambulatory Visit: Payer: 59 | Admitting: Sports Medicine

## 2021-07-31 VITALS — BP 129/86 | HR 104 | Ht 69.0 in | Wt 365.0 lb

## 2021-07-31 DIAGNOSIS — K739 Chronic hepatitis, unspecified: Secondary | ICD-10-CM | POA: Diagnosis not present

## 2021-07-31 DIAGNOSIS — K759 Inflammatory liver disease, unspecified: Secondary | ICD-10-CM | POA: Diagnosis not present

## 2021-07-31 DIAGNOSIS — R109 Unspecified abdominal pain: Secondary | ICD-10-CM | POA: Diagnosis not present

## 2021-07-31 MED ORDER — PANTOPRAZOLE SODIUM 40 MG PO TBEC: 40.0000 mg | DELAYED_RELEASE_TABLET | Freq: Every day | ORAL | 3 refills | Status: DC

## 2021-07-31 MED ORDER — TRAMADOL HCL 50 MG PO TABS: 50.0000 mg | ORAL_TABLET | Freq: Three times a day (TID) | ORAL | 0 refills | Status: DC | PRN

## 2021-07-31 NOTE — Progress Notes (Addendum)
    Procedures performed today:    None.  Independent interpretation of notes and tests performed by another provider:   CT from prior reviewed, noted hepatosplenomegaly, spine looks good.  Brief History, Exam, Impression, and Recommendations:    Acute abdominal pain Pleasant 36 year old female, history of heavy drinking, endometrial cancer post hysterectomy, cholecystectomy, comes in with several weeks of increasing midepigastric abdominal pain with radiation through to the back. She is jaundiced on exam today. She has discomfort mid epigastrium without guarding or rigidity. Previous imaging has shown hepatosplenomegaly. She has been taking a lot of NSAIDs. Considering midepigastric pain as well as jaundice we are going to get labs, image including a CT abdomen and pelvis with oral and IV contrast. Adding pantoprazole, she will hold off on Tylenol and NSAIDs and we can use low-dose tramadol for pain. I do suspect we will be getting gastroenterology involved.  Hepatitis Unclear etiology, elevated LFTs, bilirubin over 13. Cholecystectomy long time ago. Ferritin elevated. We will go ahead and get hepatology involved. I do have a long list of antibodies and imaging pending. She does have some leukocytes and nitrites in the urine, combined with abdominal pain, so we will go ahead and add Macrobid as well to cover this.    ___________________________________________ Ihor Austin. Benjamin Stain, M.D., ABFM., CAQSM. Primary Care and Sports Medicine [facility]  Adjunct Instructor of Family Medicine  [employer]

## 2021-07-31 NOTE — Assessment & Plan Note (Addendum)
 Pleasant 36 year old female, history of heavy drinking, endometrial cancer post hysterectomy, cholecystectomy, comes in with several weeks of increasing midepigastric abdominal pain with radiation through to the back. She is jaundiced on exam today. She has discomfort mid epigastrium without guarding or rigidity. Previous imaging has shown hepatosplenomegaly. She has been taking a lot of NSAIDs. Considering midepigastric pain as well as jaundice we are going to get labs, image including a CT abdomen and pelvis with oral and IV contrast. Adding pantoprazole , she will hold off on Tylenol  and NSAIDs and we can use low-dose tramadol  for pain. I do suspect we will be getting gastroenterology involved.

## 2021-08-01 DIAGNOSIS — K759 Inflammatory liver disease, unspecified: Secondary | ICD-10-CM | POA: Insufficient documentation

## 2021-08-01 MED ORDER — NITROFURANTOIN MONOHYD MACRO 100 MG PO CAPS: 100.0000 mg | ORAL_CAPSULE | Freq: Two times a day (BID) | ORAL | 0 refills | Status: DC

## 2021-08-01 NOTE — Assessment & Plan Note (Signed)
Unclear etiology, elevated LFTs, bilirubin over 13. Cholecystectomy long time ago. Ferritin elevated. We will go ahead and get hepatology involved. I do have a long list of antibodies and imaging pending. She does have some leukocytes and nitrites in the urine, combined with abdominal pain, so we will go ahead and add Macrobid as well to cover this.

## 2021-08-01 NOTE — Addendum Note (Signed)
 Addended by: CURTIS DEBBY PARAS on: 08/01/2021 03:58 PM   Modules accepted: Orders

## 2021-08-01 NOTE — Addendum Note (Signed)
 Addended by: CURTIS DEBBY PARAS on: 08/01/2021 09:32 AM   Modules accepted: Orders

## 2021-08-03 ENCOUNTER — Ambulatory Visit (INDEPENDENT_AMBULATORY_CARE_PROVIDER_SITE_OTHER): Payer: 59

## 2021-08-03 DIAGNOSIS — R1013 Epigastric pain: Secondary | ICD-10-CM

## 2021-08-03 DIAGNOSIS — R109 Unspecified abdominal pain: Secondary | ICD-10-CM

## 2021-08-03 LAB — URINALYSIS W MICROSCOPIC + REFLEX CULTURE
Bacteria, UA: NONE SEEN /HPF
Glucose, UA: NEGATIVE
Ketones, ur: NEGATIVE
Nitrites, Initial: POSITIVE — AB
Specific Gravity, Urine: 1.02 (ref 1.001–1.035)
pH: <=5 (ref 5.0–8.0)

## 2021-08-03 LAB — CBC WITH DIFFERENTIAL/PLATELET
Absolute Monocytes: 526 cells/uL (ref 200–950)
Basophils Absolute: 29 cells/uL (ref 0–200)
Basophils Relative: 0.4 %
Eosinophils Absolute: 219 cells/uL (ref 15–500)
Eosinophils Relative: 3 %
HCT: 50.3 % — ABNORMAL HIGH (ref 35.0–45.0)
Hemoglobin: 17.6 g/dL — ABNORMAL HIGH (ref 11.7–15.5)
Lymphs Abs: 1810 cells/uL (ref 850–3900)
MCH: 32.7 pg (ref 27.0–33.0)
MCHC: 35 g/dL (ref 32.0–36.0)
MCV: 93.3 fL (ref 80.0–100.0)
MPV: 11.3 fL (ref 7.5–12.5)
Monocytes Relative: 7.2 %
Neutro Abs: 4716 cells/uL (ref 1500–7800)
Neutrophils Relative %: 64.6 %
Platelets: 263 10*3/uL (ref 140–400)
RBC: 5.39 10*6/uL — ABNORMAL HIGH (ref 3.80–5.10)
RDW: 12.6 % (ref 11.0–15.0)
Total Lymphocyte: 24.8 %
WBC: 7.3 10*3/uL (ref 3.8–10.8)

## 2021-08-03 LAB — COMPLETE METABOLIC PANEL WITH GFR
AG Ratio: 1.2 (calc) (ref 1.0–2.5)
ALT: 557 U/L — ABNORMAL HIGH (ref 6–29)
AST: 371 U/L — ABNORMAL HIGH (ref 10–30)
Albumin: 4.6 g/dL (ref 3.6–5.1)
Alkaline phosphatase (APISO): 247 U/L — ABNORMAL HIGH (ref 31–125)
BUN/Creatinine Ratio: 7 (calc) (ref 6–22)
BUN: 8 mg/dL (ref 7–25)
CO2: 25 mmol/L (ref 20–32)
Calcium: 10.7 mg/dL — ABNORMAL HIGH (ref 8.6–10.2)
Chloride: 95 mmol/L — ABNORMAL LOW (ref 98–110)
Creat: 1.07 mg/dL — ABNORMAL HIGH (ref 0.50–0.97)
Globulin: 3.7 g/dL (calc) (ref 1.9–3.7)
Glucose, Bld: 109 mg/dL — ABNORMAL HIGH (ref 65–99)
Potassium: 3.4 mmol/L — ABNORMAL LOW (ref 3.5–5.3)
Sodium: 136 mmol/L (ref 135–146)
Total Bilirubin: 12.5 mg/dL — ABNORMAL HIGH (ref 0.2–1.2)
Total Protein: 8.3 g/dL — ABNORMAL HIGH (ref 6.1–8.1)
eGFR: 69 mL/min/{1.73_m2} (ref >=60)

## 2021-08-03 LAB — ANTI-SMOOTH MUSCLE ANTIBODY, IGG: Actin (Smooth Muscle) Antibody (IGG): 21 U — ABNORMAL HIGH (ref <20)

## 2021-08-03 LAB — URINE CULTURE
MICRO NUMBER:: 13574842
SPECIMEN QUALITY:: ADEQUATE

## 2021-08-03 LAB — CEA: CEA: <2 ng/mL

## 2021-08-03 LAB — CANCER ANTIGEN 19-9: CA 19-9: 17 U/mL (ref <34)

## 2021-08-03 LAB — AFP TUMOR MARKER: AFP-Tumor Marker: 7.3 ng/mL — ABNORMAL HIGH

## 2021-08-03 LAB — HEPATITIS PANEL, ACUTE
Hep A IgM: NONREACTIVE
Hep B C IgM: NONREACTIVE
Hepatitis B Surface Ag: NONREACTIVE
Hepatitis C Ab: NONREACTIVE

## 2021-08-03 LAB — LIPASE: Lipase: 42 U/L (ref 7–60)

## 2021-08-03 LAB — CERULOPLASMIN: Ceruloplasmin: 32 mg/dL (ref 18–53)

## 2021-08-03 LAB — ANTI-MICROSOMAL ANTIBODY LIVER / KIDNEY: LKM1 Ab: <=20 U (ref <=20.0)

## 2021-08-03 LAB — CULTURE INDICATED

## 2021-08-03 LAB — FERRITIN: Ferritin: 357 ng/mL — ABNORMAL HIGH (ref 16–154)

## 2021-08-03 LAB — ALPHA-1-ANTITRYPSIN: A-1 Antitrypsin, Ser: 116 mg/dL (ref 83–199)

## 2021-08-03 LAB — AMYLASE: Amylase: 22 U/L (ref 21–101)

## 2021-08-03 MED ORDER — IOHEXOL 300 MG/ML  SOLN
100.0000 mL | Freq: Once | INTRAMUSCULAR | Status: AC | PRN
  Administered 2021-08-03: 100 mL via INTRAVENOUS

## 2021-08-28 ENCOUNTER — Ambulatory Visit: Payer: 59 | Admitting: Sports Medicine

## 2021-08-28 DIAGNOSIS — K759 Inflammatory liver disease, unspecified: Secondary | ICD-10-CM | POA: Diagnosis not present

## 2021-08-28 MED ORDER — VALSARTAN-HYDROCHLOROTHIAZIDE 80-12.5 MG PO TABS: 1.0000 | ORAL_TABLET | Freq: Every day | ORAL | 0 refills | Status: DC

## 2021-08-28 NOTE — Assessment & Plan Note (Signed)
Pleasant 36 year old female, history of multiple cancers including colon cancer, endometrial carcinoma, she also has history of heavy alcohol consumption. At the last visit she was significantly jaundiced, she was having abdominal pain, we obtained a plethora of labs as well as a CT abdomen and pelvis with oral and IV contrast. Liver function, bilirubin, ferritin significantly abnormal, AFP elevated, CT abdomen and pelvis did not show any evidence of hepatic tumor though. We did not end up checking her PT/INR. Treated her conservatively with acid blockers and I did order a consultation with hepatology considering elevated ferritin levels. Has not seen hepatology yet, we are working to see what happened here with a consultation. I would like to recheck her liver function, coags, and ferritin today. She does plan to establish care with Dr. Mel Almond.

## 2021-08-28 NOTE — Progress Notes (Signed)
    Procedures performed today:    None.  Independent interpretation of notes and tests performed by another provider:   None.  Brief History, Exam, Impression, and Recommendations:    Hepatitis Pleasant 36 year old female, history of multiple cancers including colon cancer, endometrial carcinoma, she also has history of heavy alcohol consumption. At the last visit she was significantly jaundiced, she was having abdominal pain, we obtained a plethora of labs as well as a CT abdomen and pelvis with oral and IV contrast. Liver function, bilirubin, ferritin significantly abnormal, AFP elevated, CT abdomen and pelvis did not show any evidence of hepatic tumor though. We did not end up checking her PT/INR. Treated her conservatively with acid blockers and I did order a consultation with hepatology considering elevated ferritin levels. Has not seen hepatology yet, we are working to see what happened here with a consultation. I would like to recheck her liver function, coags, and ferritin today. She does plan to establish care with Dr. Mel Almond.    ____________________________________________ Gwen Her. Dianah Field, M.D., ABFM., CAQSM., AME. Primary Care and Sports Medicine [facility]  Adjunct Professor of [employer]  Risk manager

## 2021-08-29 LAB — COMPLETE METABOLIC PANEL WITH GFR
AG Ratio: 1.3 (calc) (ref 1.0–2.5)
ALT: 36 U/L — ABNORMAL HIGH (ref 6–29)
AST: 27 U/L (ref 10–30)
Albumin: 4 g/dL (ref 3.6–5.1)
Alkaline phosphatase (APISO): 81 U/L (ref 31–125)
BUN: 12 mg/dL (ref 7–25)
CO2: 27 mmol/L (ref 20–32)
Calcium: 9.2 mg/dL (ref 8.6–10.2)
Chloride: 99 mmol/L (ref 98–110)
Creat: 0.76 mg/dL (ref 0.50–0.97)
Globulin: 3.2 g/dL (calc) (ref 1.9–3.7)
Glucose, Bld: 88 mg/dL (ref 65–99)
Potassium: 4.4 mmol/L (ref 3.5–5.3)
Sodium: 137 mmol/L (ref 135–146)
Total Bilirubin: 1.6 mg/dL — ABNORMAL HIGH (ref 0.2–1.2)
Total Protein: 7.2 g/dL (ref 6.1–8.1)
eGFR: 105 mL/min/{1.73_m2} (ref >=60)

## 2021-08-29 LAB — CBC
HCT: 43 % (ref 35.0–45.0)
Hemoglobin: 14.8 g/dL (ref 11.7–15.5)
MCH: 32 pg (ref 27.0–33.0)
MCHC: 34.4 g/dL (ref 32.0–36.0)
MCV: 93.1 fL (ref 80.0–100.0)
MPV: 11.5 fL (ref 7.5–12.5)
Platelets: 205 10*3/uL (ref 140–400)
RBC: 4.62 10*6/uL (ref 3.80–5.10)
RDW: 12.1 % (ref 11.0–15.0)
WBC: 6.6 10*3/uL (ref 3.8–10.8)

## 2021-08-29 LAB — PROTIME-INR
INR: 1
Prothrombin Time: 10.3 s (ref 9.0–11.5)

## 2021-08-29 LAB — FERRITIN: Ferritin: 176 ng/mL — ABNORMAL HIGH (ref 16–154)

## 2021-09-14 NOTE — Progress Notes (Signed)
Established Patient Office Visit  Subjective   Patient ID: Rachel Herrera, female    DOB: 1985/04/07  Age: 36 y.o. MRN: [id number]  Chief Complaint  Patient presents with   Establish Care    She is here to establish care. She has completely stopped taking alcohol. She has good social support with her husband and family. 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 She has is in the process of adjusting. She is hoping to move to the day shift.    Eye doctor: it has been a while. She will make apt  Dentist: She is saving up to get teeth work done.  Alcohol: none Smoking: quit cigarettes 2018 but still vapes  Sexually active: not really  No STD testing today  Overall: pt feels she is doing well. Diet is improving with salad for lunch. She drinks water throughout the day.  Sleep: she take melatonin 5 days a week at the 2.'5mg'$ . She does admit to some daytime grogginess.   Review of Systems  Constitutional:  Negative for chills and fever.  Respiratory:  Negative for cough and shortness of breath.   Cardiovascular:  Negative for chest pain.  Neurological:  Negative for headaches.      Objective:     BP 139/77   Pulse 85   Ht '5\' 9"'$  (1.753 m)   Wt (!) 379 lb (171.9 kg)   SpO2 95%   BMI 55.97 kg/m    Physical Exam Vitals and nursing note reviewed.  Constitutional:      General: She is not in acute distress.    Appearance: She is obese.  HENT:     Head: Normocephalic and atraumatic.     Right Ear: Tympanic membrane, ear canal and external ear normal.     Left Ear: Tympanic membrane, ear canal and external ear normal.     Nose: Nose normal.     Mouth/Throat:     Comments: Teeth are yellow with cavities.  Eyes:     Conjunctiva/sclera: Conjunctivae normal.  Cardiovascular:     Rate and Rhythm: Normal rate and regular rhythm.  Pulmonary:     Effort: Pulmonary effort is normal.     Breath sounds: Normal breath sounds.  Abdominal:     General: Abdomen  is flat. Bowel sounds are normal.     Palpations: Abdomen is soft.  Musculoskeletal:     Comments: 5/5 muscle strength bilaterally  Neurological:     General: No focal deficit present.     Mental Status: She is alert and oriented to person, place, and time.     Cranial Nerves: No cranial nerve deficit.  Psychiatric:        Mood and Affect: Mood normal.        Behavior: Behavior normal.        Thought Content: Thought content normal.        Judgment: Judgment normal.    No results found for any visits on 09/15/21.    Assessment & Plan:   Problem List Items Addressed This Visit       Other   Annual physical exam - Primary    - currently up to date on screenings  - labs done two weeks ago with Dr. Darene Lamer so none needed today      Muscle spasm of shoulder region    - refilled tramadol. Hesitant to give msk relaxor with metabolism in the liver at this time. Advised her to follow with hepatology  and ask their opinion      Relevant Medications   traMADol (ULTRAM) 50 MG tablet   Depression, major, single episode, moderate (HCC)    - PHQ9: 13 discussed medical therapy but patient is hesitant at this time  - also concerned about SSRI with going through the liver with her hepatitis. Advised her to discuss with hepatologist.  - pt denies suicidal and homicidal ideation       Return in about 3 months (around 12/16/2021).    Owens Loffler, DO

## 2021-09-15 ENCOUNTER — Encounter: Payer: Self-pay | Admitting: Family Medicine

## 2021-09-15 ENCOUNTER — Ambulatory Visit: Payer: 59 | Admitting: Family Medicine

## 2021-09-15 VITALS — BP 139/77 | HR 85 | Ht 69.0 in | Wt 379.0 lb

## 2021-09-15 DIAGNOSIS — M62838 Other muscle spasm: Secondary | ICD-10-CM | POA: Diagnosis not present

## 2021-09-15 DIAGNOSIS — Z0000 Encounter for general adult medical examination without abnormal findings: Secondary | ICD-10-CM | POA: Diagnosis not present

## 2021-09-15 DIAGNOSIS — F321 Major depressive disorder, single episode, moderate: Secondary | ICD-10-CM

## 2021-09-15 MED ORDER — TRAMADOL HCL 50 MG PO TABS: 50.0000 mg | ORAL_TABLET | Freq: Three times a day (TID) | ORAL | 0 refills | Status: DC | PRN

## 2021-09-15 NOTE — Assessment & Plan Note (Signed)
-   PHQ9: 13 discussed medical therapy but patient is hesitant at this time  - also concerned about SSRI with going through the liver with her hepatitis. Advised her to discuss with hepatologist.  - pt denies suicidal and homicidal ideation

## 2021-09-15 NOTE — Patient Instructions (Signed)
Ask Hepatologist about:  Best antidepressive medicine for liver--> SSRIs (lexapro) even ask wellbutrin Ask about medication for shoulder pain with work  (msk relaxers ok?)

## 2021-09-15 NOTE — Assessment & Plan Note (Signed)
-   refilled tramadol. Hesitant to give msk relaxor with metabolism in the liver at this time. Advised her to follow with hepatology and ask their opinion

## 2021-09-15 NOTE — Assessment & Plan Note (Signed)
-   currently up to date on screenings  - labs done two weeks ago with Dr. Darene Lamer so none needed today

## 2021-10-26 ENCOUNTER — Other Ambulatory Visit: Payer: Self-pay | Admitting: Sports Medicine

## 2021-10-26 DIAGNOSIS — R109 Unspecified abdominal pain: Secondary | ICD-10-CM

## 2021-12-19 ENCOUNTER — Encounter: Payer: Self-pay | Admitting: Family Medicine

## 2021-12-19 ENCOUNTER — Ambulatory Visit: Payer: 59 | Admitting: Family Medicine

## 2021-12-19 VITALS — BP 157/95 | HR 58 | Ht 69.0 in | Wt 394.0 lb

## 2021-12-19 DIAGNOSIS — I10 Essential (primary) hypertension: Secondary | ICD-10-CM

## 2021-12-19 DIAGNOSIS — Z23 Encounter for immunization: Secondary | ICD-10-CM

## 2021-12-19 DIAGNOSIS — Z6841 Body Mass Index (BMI) 40.0 and over, adult: Secondary | ICD-10-CM | POA: Diagnosis not present

## 2021-12-19 DIAGNOSIS — M62838 Other muscle spasm: Secondary | ICD-10-CM

## 2021-12-19 LAB — POCT GLYCOSYLATED HEMOGLOBIN (HGB A1C): Hemoglobin A1C: 4.9 % (ref 4.0–5.6)

## 2021-12-19 MED ORDER — VALSARTAN-HYDROCHLOROTHIAZIDE 80-12.5 MG PO TABS: 1.0000 | ORAL_TABLET | Freq: Every day | ORAL | 3 refills | Status: AC

## 2021-12-19 NOTE — Assessment & Plan Note (Signed)
-   elevated BP however patient has been out of her medication - refill sent into pharmacy  - discussed diet and exercise

## 2021-12-19 NOTE — Progress Notes (Signed)
Established patient visit   Patient: Rachel Herrera   DOB: 1985-05-22   36 y.o. Female  MRN: [id number] Visit Date: 12/19/2021  Today's healthcare provider: Owens Loffler, DO   Chief Complaint  Patient presents with   Follow-up    SUBJECTIVE    Chief Complaint  Patient presents with   Follow-up   HPI  Pt is here to follow up on muscle spasms of her shoulder as well as depression. She admits to continued muscle spasms but notes that the spasms are not as frequent as they used to be.   Depression She admits to continued depression but thinks she is handling it well and does not want medication at this time. She denies suicidal ideation.   Weight gain She is working from home and has not been exercising as much as she would like. She has a goal to start getting back to work.   HTN Pt is on Valsartan-hctz 80-12.'5mg'$ . She has been out of her BP medication for the last week or so. BP elevated today on exam. Denies headaches, chest pain, shortness of breath, or blurry vision.  She was recently discharged from the hospital for cholecystectomy.   Review of Systems  Constitutional:  Negative for activity change, fatigue and fever.  Respiratory:  Negative for cough and shortness of breath.   Cardiovascular:  Negative for chest pain.  Gastrointestinal:  Negative for abdominal pain.  Genitourinary:  Negative for difficulty urinating.       Current Meds  Medication Sig   AMBULATORY NON FORMULARY MEDICATION Supply ordered: CPAP and other supplies needed (headgear, cushions, filters, heated tuubing and water chamber) Dx: obstructive sleep apnea Settings: auto-titration 5-20 cmH2O   estradiol (VIVELLE-DOT) 0.05 MG/24HR patch Place onto the skin.   Melatonin 5 MG TABS Take 2.5-5 mg by mouth at bedtime as needed (sleep).    [DISCONTINUED] valsartan-hydrochlorothiazide (DIOVAN-HCT) 80-12.5 MG tablet Take 1 tablet by mouth daily.    OBJECTIVE    BP (!) 157/95   Pulse (!)  58   Ht '5\' 9"'$  (1.753 m)   Wt (!) 394 lb (178.7 kg)   SpO2 99%   BMI 58.18 kg/m   Physical Exam Vitals and nursing note reviewed.  Constitutional:      General: She is not in acute distress.    Appearance: She is obese.  HENT:     Head: Normocephalic and atraumatic.     Right Ear: External ear normal.     Left Ear: External ear normal.     Nose: Nose normal.  Eyes:     Conjunctiva/sclera: Conjunctivae normal.  Cardiovascular:     Rate and Rhythm: Normal rate and regular rhythm.  Pulmonary:     Effort: Pulmonary effort is normal.     Breath sounds: Normal breath sounds.  Musculoskeletal:     Comments: Tender to palpation of R trapezius muscle  Neurological:     General: No focal deficit present.     Mental Status: She is alert and oriented to person, place, and time.  Psychiatric:        Mood and Affect: Mood normal.        Behavior: Behavior normal.        Thought Content: Thought content normal.        Judgment: Judgment normal.         ASSESSMENT & PLAN    Problem List Items Addressed This Visit       Cardiovascular and Mediastinum  Essential hypertension - Primary    - elevated BP however patient has been out of her medication - refill sent into pharmacy  - discussed diet and exercise        Relevant Medications   valsartan-hydrochlorothiazide (DIOVAN-HCT) 80-12.5 MG tablet     Other   Class 3 severe obesity due to excess calories with serious comorbidity and body mass index (BMI) of 50.0 to 59.9 in adult [facility])    - discussed weight loss options including wegovy however pt says her insurance does not cover weight loss medication. Offered referral to healthy weight and wellness but pt would like to wait at this time - pt did have recent weight gain and likely related to her more sedentary lifestyle since working from home. Encouraged daily exercise.  - POC A1C 4.9%      Relevant Orders   POCT HgB A1C (Completed)   Muscle spasm of shoulder region    -  provided pt with exercises to do at home - if no better, recommend formal PT  - also recommended salon pas patches for her shoulder      Immunization due    - pt received flu and covid vaccine today. Tolerated both well      Relevant Orders   Pfizer Fall 2023 Covid-19 Vaccine 76yr and older (Completed)   Flu Vaccine QUAD 6+ mos PF IM (Fluarix Quad PF) (Completed)    Return in about 4 months (around 04/19/2022).      Meds ordered this encounter  Medications   valsartan-hydrochlorothiazide (DIOVAN-HCT) 80-12.5 MG tablet    Sig: Take 1 tablet by mouth daily.    Dispense:  90 tablet    Refill:  3    Orders Placed This [employer] 2023 Covid-19 Vaccine 159yrand older   Flu Vaccine QUAD 6+ mos PF IM (Fluarix Quad PF)   POCT HgB A1C     ErOwens [phone]) [phone])  [place]

## 2021-12-19 NOTE — Assessment & Plan Note (Signed)
-   pt received flu and covid vaccine today. Tolerated both well

## 2021-12-19 NOTE — Assessment & Plan Note (Signed)
-   provided pt with exercises to do at home - if no better, recommend formal PT  - also recommended salon pas patches for her shoulder

## 2021-12-19 NOTE — Assessment & Plan Note (Signed)
-   discussed weight loss options including wegovy however pt says her insurance does not cover weight loss medication. Offered referral to healthy weight and wellness but pt would like to wait at this time - pt did have recent weight gain and likely related to her more sedentary lifestyle since working from home. Encouraged daily exercise.  - POC A1C 4.9%

## 2021-12-20 ENCOUNTER — Telehealth: Payer: Self-pay | Admitting: General Practice

## 2021-12-20 NOTE — Telephone Encounter (Signed)
Transition Care Management Follow-up Telephone Call Date of discharge and from where: 12/18/21 from [place] How have you been since you were released from the hospital? Patient had OV with PCP on 12/19/21. Any questions or concerns? No

## 2022-04-24 ENCOUNTER — Ambulatory Visit: Payer: 59 | Admitting: Family Medicine

## 2022-12-25 ENCOUNTER — Other Ambulatory Visit: Payer: Self-pay | Admitting: Family Medicine

## 2022-12-25 DIAGNOSIS — I10 Essential (primary) hypertension: Secondary | ICD-10-CM

## 2023-06-07 IMAGING — CT CT ABD-PELV W/ CM
2 of 4 series · 17 of 46 positions shown, 19 images · IV contrast (APPLIED)
Comparison: CT abdomen and pelvis 12/24/2018

CLINICAL DATA: Uterine cancer

EXAM:
CT ABDOMEN AND PELVIS WITH CONTRAST
TECHNIQUE: Multidetector CT imaging of the abdomen and pelvis was performed
using the standard protocol following bolus administration of
intravenous contrast.

[Series 3: axial st · axial · 0.98mm/px · z∈[+877,+1352]mm · 14 of 107 slices shown, 16 images]
[im 6/107  soft-tissue]
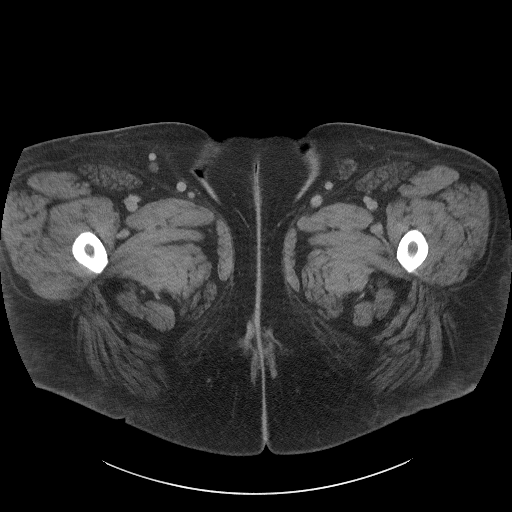
[im 6/107  bone]
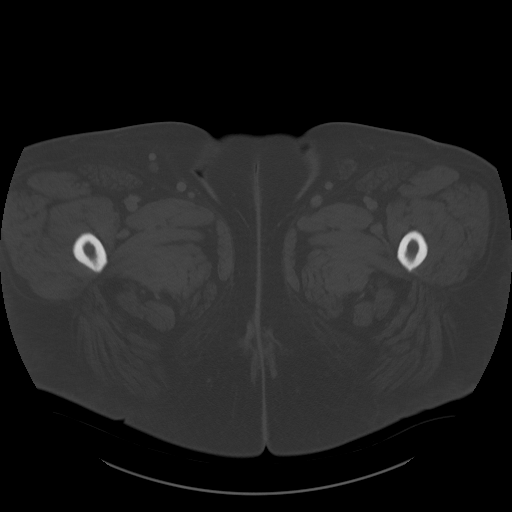
[im 12/107  soft-tissue]
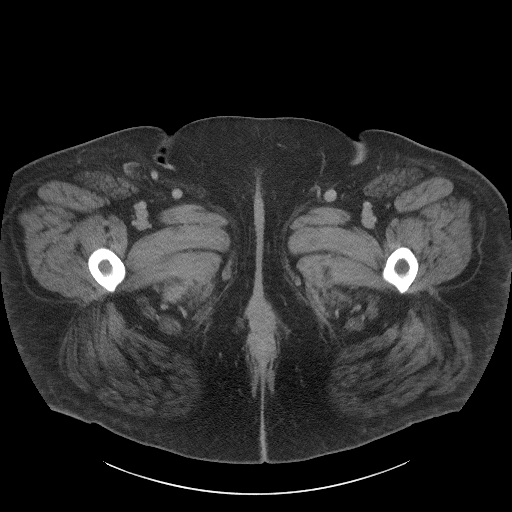
[im 24/107  soft-tissue]
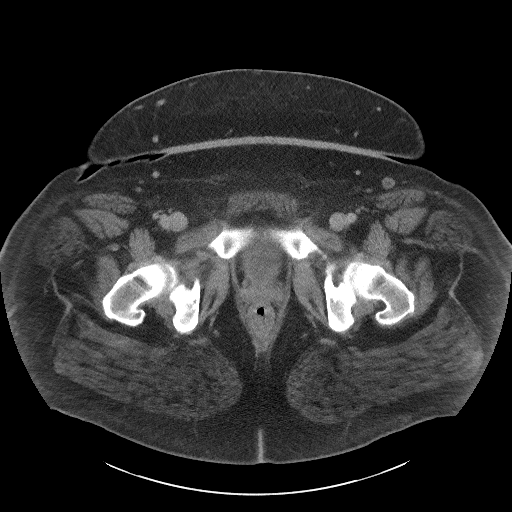
[im 30/107  soft-tissue]
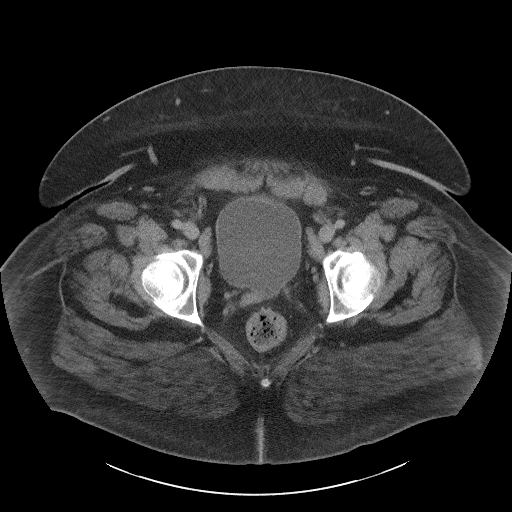
[im 36/107  soft-tissue]
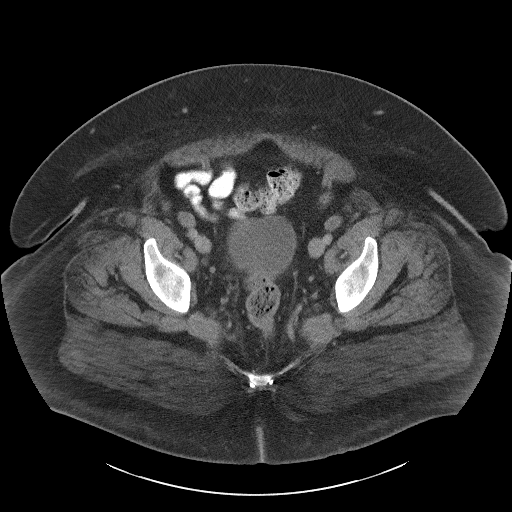
[im 42/107  soft-tissue]
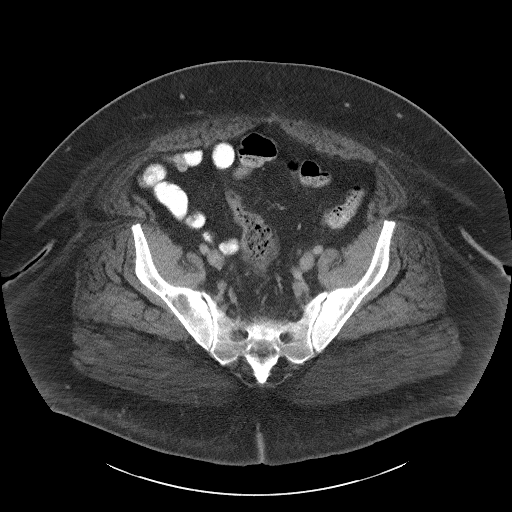
[im 48/107  soft-tissue]
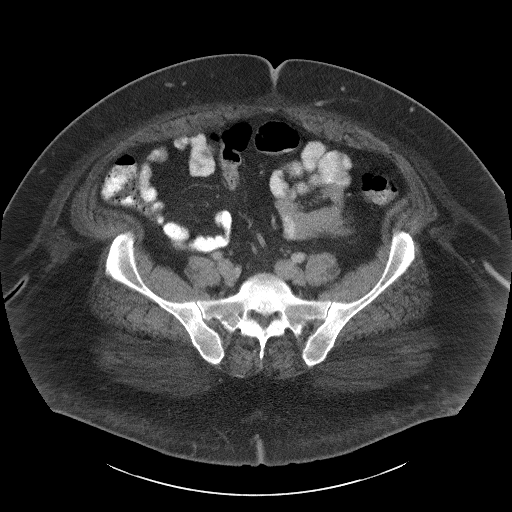
[im 59/107  soft-tissue]
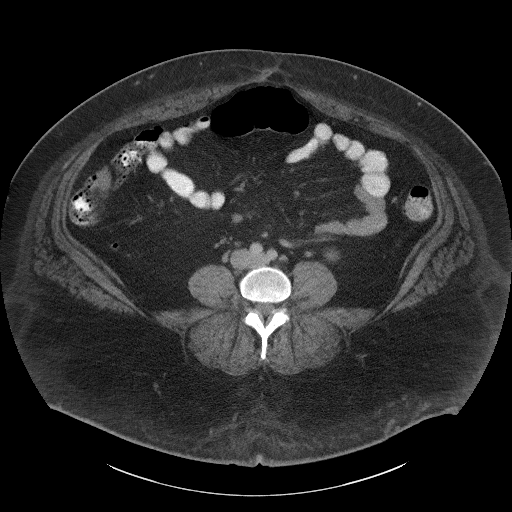
[im 65/107  soft-tissue]
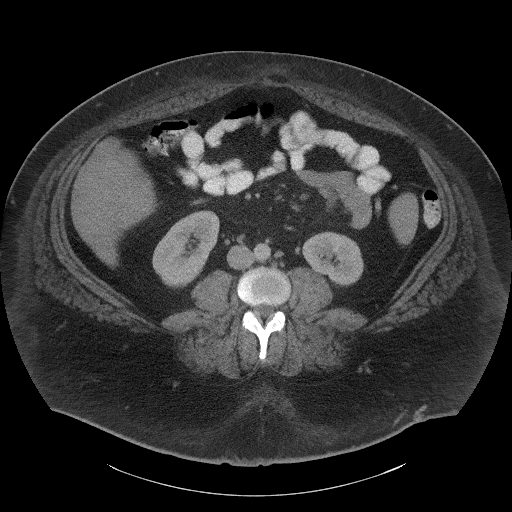
[im 65/107  bone]
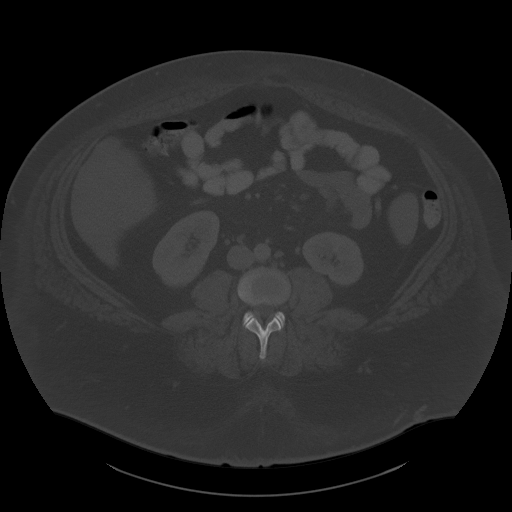
[im 71/107  soft-tissue]
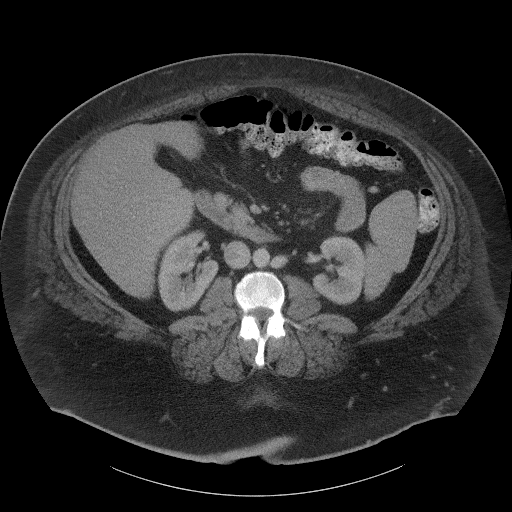
[im 77/107  soft-tissue]
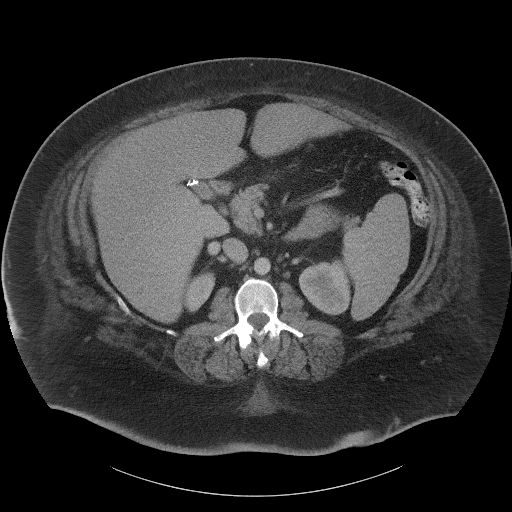
[im 83/107  soft-tissue]
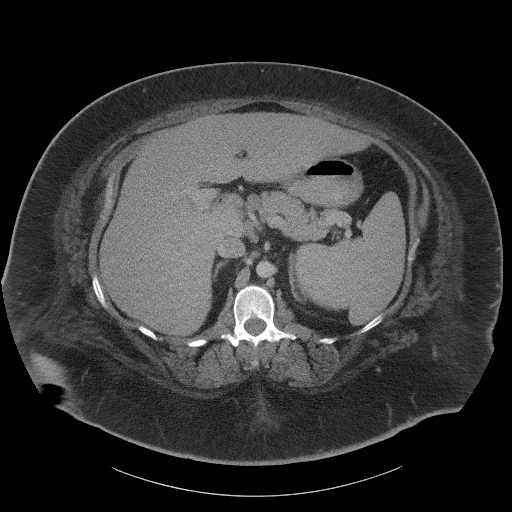
[im 95/107  soft-tissue]
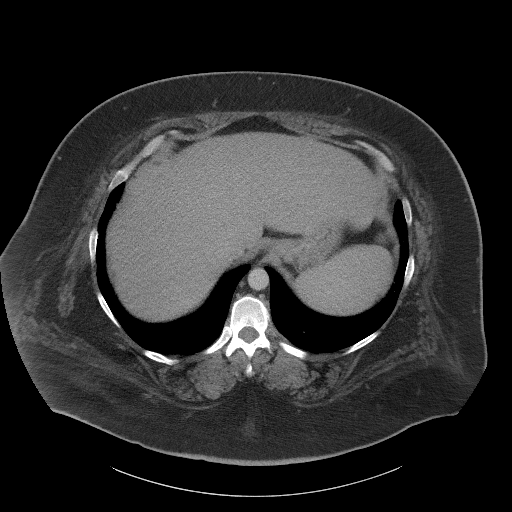
[im 101/107  soft-tissue]
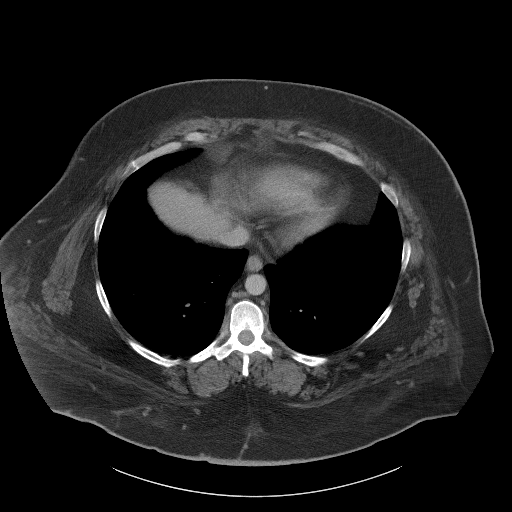

[Series 6: coronal st · coronal · 1.11mm/px · 3 of 156 slices shown]
[im 52/156  soft-tissue]
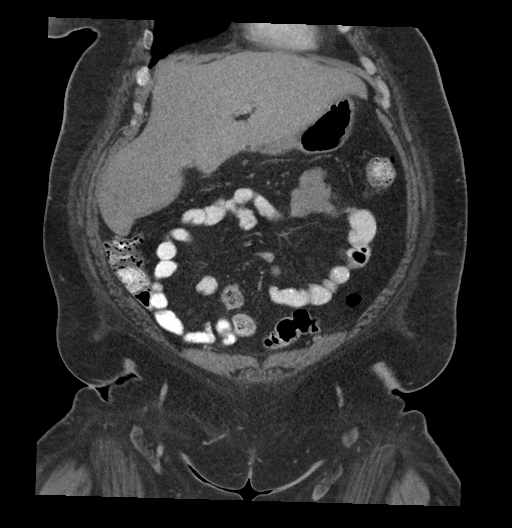
[im 69/156  soft-tissue]
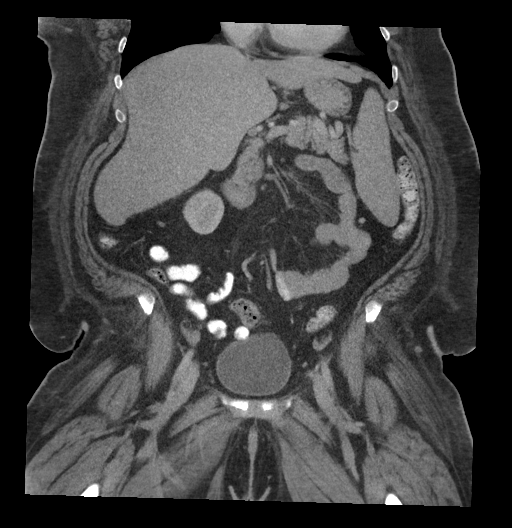
[im 87/156  soft-tissue]
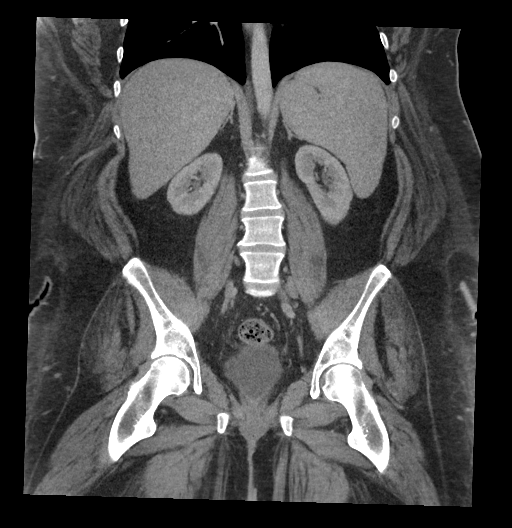

[17 of 46 positions shown; findings below may reference images not displayed]

RADIATION DOSE REDUCTION: This exam was performed according to the
departmental dose-optimization program which includes automated
exposure control, adjustment of the mA and/or kV according to
patient size and/or use of iterative reconstruction technique.

CONTRAST:  100mL OMNIPAQUE IOHEXOL 300 MG/ML  SOLN
FINDINGS: Lower chest: No acute abnormality.

Hepatobiliary: Liver is enlarged measuring 21.3 cm in length, with
no suspicious mass identified. Gallbladder is surgically absent. No
biliary ductal dilatation identified.

Pancreas: Unremarkable. No pancreatic ductal dilatation or
surrounding inflammatory changes.

Spleen: Spleen is enlarged measuring 17 cm in length.

Adrenals/Urinary Tract: Adrenal glands are unremarkable. Kidneys are
normal, without renal calculi, focal lesion, or hydronephrosis.
Bladder is unremarkable.

Stomach/Bowel: Stomach is within normal limits. Appendix appears
normal. No evidence of bowel wall thickening, distention, or
inflammatory changes.

Vascular/Lymphatic: Mildly decreased size of prominent bilateral
pelvic lymph nodes measuring up to 13 mm in short axis on the left.
Stable mildly prominent portacaval lymph nodes. No new
lymphadenopathy identified.

Reproductive: Status post hysterectomy. No adnexal masses.

Other: No ascites.

Musculoskeletal: No acute or significant osseous findings.
IMPRESSION: 1. No new suspicious mass or lymphadenopathy identified in the
abdomen or pelvis. Mildly decreased size of prominent bilateral
pelvic lymph nodes since previous study.
2. Hepatosplenomegaly.

## 2023-06-13 ENCOUNTER — Encounter: Payer: Self-pay | Admitting: Family Medicine

## 2023-10-08 ENCOUNTER — Encounter: Payer: Self-pay | Admitting: Sports Medicine
